# Patient Record
Sex: Female | Born: 1950
Health system: Southern US, Community
[De-identification: ages and names within clinical notes are randomized; demographics above are authoritative.]

## PROBLEM LIST (undated history)

## (undated) DIAGNOSIS — E119 Type 2 diabetes mellitus without complications: Secondary | ICD-10-CM

## (undated) DIAGNOSIS — I10 Essential (primary) hypertension: Secondary | ICD-10-CM

## (undated) HISTORY — DX: Type 2 diabetes mellitus without complications: E11.9

## (undated) HISTORY — PX: BUNIONECTOMY: SHX129

## (undated) HISTORY — DX: Essential (primary) hypertension: I10

## (undated) HISTORY — PX: TONSILLECTOMY: SUR1361

---

## 1999-10-18 ENCOUNTER — Other Ambulatory Visit: Admission: RE | Admit: 1999-10-18 | Discharge: 1999-10-18 | Payer: Self-pay | Admitting: Obstetrics and Gynecology

## 2000-07-23 ENCOUNTER — Encounter: Payer: Self-pay | Admitting: Family Medicine

## 2000-07-23 ENCOUNTER — Encounter: Admission: RE | Admit: 2000-07-23 | Discharge: 2000-07-23 | Payer: Self-pay | Admitting: Family Medicine

## 2000-08-09 ENCOUNTER — Other Ambulatory Visit: Admission: RE | Admit: 2000-08-09 | Discharge: 2000-08-09 | Payer: Self-pay | Admitting: Family Medicine

## 2001-04-26 ENCOUNTER — Other Ambulatory Visit: Admission: RE | Admit: 2001-04-26 | Discharge: 2001-04-26 | Payer: Self-pay | Admitting: Obstetrics and Gynecology

## 2003-01-01 ENCOUNTER — Other Ambulatory Visit: Admission: RE | Admit: 2003-01-01 | Discharge: 2003-01-01 | Payer: Self-pay | Admitting: Obstetrics and Gynecology

## 2004-09-29 ENCOUNTER — Encounter: Admission: RE | Admit: 2004-09-29 | Discharge: 2004-09-29 | Payer: Self-pay | Admitting: Family Medicine

## 2004-12-07 ENCOUNTER — Encounter: Admission: RE | Admit: 2004-12-07 | Discharge: 2004-12-07 | Payer: Self-pay | Admitting: Internal Medicine

## 2013-05-05 ENCOUNTER — Other Ambulatory Visit: Payer: Self-pay | Admitting: Nurse Practitioner

## 2013-05-05 DIAGNOSIS — Z87898 Personal history of other specified conditions: Secondary | ICD-10-CM

## 2013-07-23 ENCOUNTER — Ambulatory Visit (INDEPENDENT_AMBULATORY_CARE_PROVIDER_SITE_OTHER): Payer: BC Managed Care – PPO | Admitting: Nurse Practitioner

## 2013-07-23 ENCOUNTER — Encounter (INDEPENDENT_AMBULATORY_CARE_PROVIDER_SITE_OTHER): Payer: Self-pay

## 2013-07-23 ENCOUNTER — Encounter: Payer: Self-pay | Admitting: Nurse Practitioner

## 2013-07-23 VITALS — BP 152/93 | HR 61 | Temp 97.6°F | Ht 64.0 in | Wt 184.0 lb

## 2013-07-23 DIAGNOSIS — Z01419 Encounter for gynecological examination (general) (routine) without abnormal findings: Secondary | ICD-10-CM

## 2013-07-23 DIAGNOSIS — Z0000 Encounter for general adult medical examination without abnormal findings: Secondary | ICD-10-CM

## 2013-07-23 DIAGNOSIS — Z9189 Other specified personal risk factors, not elsewhere classified: Secondary | ICD-10-CM

## 2013-07-23 LAB — POCT CBC
Granulocyte percent: 70.6 %G (ref 37–80)
HCT, POC: 43.9 % (ref 37.7–47.9)
Hemoglobin: 14 g/dL (ref 12.2–16.2)
MCHC: 31.8 g/dL (ref 31.8–35.4)
MPV: 8.1 fL (ref 0–99.8)
POC Granulocyte: 8 — AB (ref 2–6.9)
POC LYMPH PERCENT: 26.7 %L (ref 10–50)
Platelet Count, POC: 260 10*3/uL (ref 142–424)
RBC: 5.5 M/uL — AB (ref 4.04–5.48)

## 2013-07-23 LAB — POCT URINALYSIS DIPSTICK
Bilirubin, UA: NEGATIVE
Glucose, UA: NEGATIVE
Ketones, UA: NEGATIVE
Nitrite, UA: NEGATIVE
Protein, UA: NEGATIVE
Spec Grav, UA: 1.015
Urobilinogen, UA: NEGATIVE
pH, UA: 5

## 2013-07-23 NOTE — Progress Notes (Signed)
  Subjective:    Patient ID: Kara Cortez, female    DOB: Dec 18, 1950, 62 y.o.   MRN: [id number]  HPI  Patient in today for CPE and PAP- Doing well- no current medical problems- no meds Petinet has no complaints today.     Review of Systems  Constitutional: Negative.   HENT: Negative.   Eyes: Negative.   Respiratory: Negative.   Cardiovascular: Negative.   Gastrointestinal: Negative.   Endocrine: Negative.   Genitourinary: Negative.   Musculoskeletal: Negative.   Hematological: Negative.   Psychiatric/Behavioral: Negative.        Objective:   Physical Exam  Constitutional: She is oriented to person, place, and time. She appears well-developed and well-nourished.  HENT:  Head: Normocephalic.  Right Ear: Hearing, tympanic membrane, external ear and ear canal normal.  Left Ear: Hearing, tympanic membrane, external ear and ear canal normal.  Nose: Nose normal.  Mouth/Throat: Uvula is midline and oropharynx is clear and moist.  Eyes: Conjunctivae and EOM are normal. Pupils are equal, round, and reactive to light.  Neck: Normal range of motion and full passive range of motion without pain. Neck supple. No JVD present. Carotid bruit is not present. No mass and no thyromegaly present.  Cardiovascular: Normal rate, normal heart sounds and intact distal pulses.   No murmur heard. Pulmonary/Chest: Effort normal and breath sounds normal.  Abdominal: Soft. Bowel sounds are normal. She exhibits no mass. There is no tenderness.  Genitourinary: Vagina normal and uterus normal. No breast swelling, tenderness, discharge or bleeding.  bimanual exam-No adnexal masses or tenderness. Cervix parous and pink- no discharge   Musculoskeletal: Normal range of motion.  Lymphadenopathy:    She has no cervical adenopathy.  Neurological: She is alert and oriented to person, place, and time.  Skin: Skin is warm and dry.  Psychiatric: She has a normal mood and affect. Her behavior is normal.  Judgment and thought content normal.    BP 152/93  Pulse 61  Temp(Src) 97.6 F (36.4 C) (Oral)  Ht 5\' 4"  (1.626 m)  Wt 184 lb (83.462 kg)  BMI 31.57 kg/m2       Assessment & Plan:   1. Annual physical exam   2. GYN exam for high-risk Medicare patient    Orders Placed This Encounter  Procedures  . CMP14+EGFR  . NMR, lipoprofile  . Thyroid Panel With TSH  . POCT urinalysis dipstick  . POCT CBC   Diet and exercise encouraged Labs pending Follow up in 1 year and prn Keep diary of blood pressure if stays above 140 systolic will need meds Mary-Margaret Daphine Deutscher, FNP

## 2013-07-23 NOTE — Patient Instructions (Signed)

## 2013-07-25 LAB — CMP14+EGFR
ALT: 14 IU/L (ref 0–32)
AST: 16 IU/L (ref 0–40)
Albumin/Globulin Ratio: 1.8 (ref 1.1–2.5)
Albumin: 4.3 g/dL (ref 3.6–4.8)
Alkaline Phosphatase: 103 IU/L (ref 39–117)
BUN/Creatinine Ratio: 17 (ref 11–26)
BUN: 13 mg/dL (ref 8–27)
CO2: 24 mmol/L (ref 18–29)
Calcium: 9 mg/dL (ref 8.6–10.2)
Chloride: 100 mmol/L (ref 97–108)
Creatinine, Ser: 0.77 mg/dL (ref 0.57–1.00)
GFR calc Af Amer: 96 mL/min/{1.73_m2} (ref >59)
GFR calc non Af Amer: 84 mL/min/{1.73_m2} (ref >59)
Globulin, Total: 2.4 g/dL (ref 1.5–4.5)
Glucose: 91 mg/dL (ref 65–99)
Potassium: 4 mmol/L (ref 3.5–5.2)
Sodium: 141 mmol/L (ref 134–144)
Total Bilirubin: 0.3 mg/dL (ref 0.0–1.2)
Total Protein: 6.7 g/dL (ref 6.0–8.5)

## 2013-07-25 LAB — NMR, LIPOPROFILE
Cholesterol: 190 mg/dL (ref <200)
HDL Cholesterol by NMR: 66 mg/dL (ref >=40)
HDL Particle Number: 34.8 umol/L (ref >=30.5)
LDL Particle Number: 858 nmol/L (ref <1000)
LDL Size: 21.1 nm (ref >20.5)
LDLC SERPL CALC-MCNC: 95 mg/dL (ref <100)
LP-IR Score: 37 (ref <=45)
Small LDL Particle Number: 278 nmol/L (ref <=527)
Triglycerides by NMR: 145 mg/dL (ref <150)

## 2013-07-25 LAB — THYROID PANEL WITH TSH
Free Thyroxine Index: 1.9 (ref 1.2–4.9)
T3 Uptake Ratio: 31 % (ref 24–39)
TSH: 1.87 u[IU]/mL (ref 0.450–4.500)

## 2013-07-28 LAB — PAP IG W/ RFLX HPV ASCU

## 2013-08-04 ENCOUNTER — Encounter: Payer: Self-pay | Admitting: *Deleted

## 2014-08-21 ENCOUNTER — Ambulatory Visit (INDEPENDENT_AMBULATORY_CARE_PROVIDER_SITE_OTHER): Payer: BC Managed Care – PPO | Admitting: Nurse Practitioner

## 2014-08-21 ENCOUNTER — Encounter: Payer: Self-pay | Admitting: Nurse Practitioner

## 2014-08-21 VITALS — BP 168/95 | HR 63 | Temp 97.4°F | Ht 64.0 in | Wt 178.0 lb

## 2014-08-21 DIAGNOSIS — I10 Essential (primary) hypertension: Secondary | ICD-10-CM

## 2014-08-21 DIAGNOSIS — Z0000 Encounter for general adult medical examination without abnormal findings: Secondary | ICD-10-CM

## 2014-08-21 MED ORDER — LISINOPRIL 20 MG PO TABS: 20.0000 mg | ORAL_TABLET | Freq: Every day | ORAL | Status: DC

## 2014-08-21 NOTE — Patient Instructions (Signed)

## 2014-08-21 NOTE — Progress Notes (Signed)
   Subjective:    Patient ID: Kara Cortez, female    DOB: May 12, 1951, 63 y.o.   MRN: [id number]  HPI Patient in today for annual physical- she has no medical problems and is on no meds.    Review of Systems  Constitutional: Negative.   HENT: Negative.   Respiratory: Negative.   Cardiovascular: Negative.   Gastrointestinal: Negative.   Genitourinary: Negative.   Neurological: Negative.   Psychiatric/Behavioral: Negative.   All other systems reviewed and are negative.      Objective:   Physical Exam  Constitutional: She is oriented to person, place, and time. She appears well-developed and well-nourished.  HENT:  Nose: Nose normal.  Mouth/Throat: Oropharynx is clear and moist.  Eyes: EOM are normal.  Neck: Trachea normal, normal range of motion and full passive range of motion without pain. Neck supple. No JVD present. Carotid bruit is not present. No thyromegaly present.  Cardiovascular: Normal rate, regular rhythm, normal heart sounds and intact distal pulses.  Exam reveals no gallop and no friction rub.   No murmur heard. Pulmonary/Chest: Effort normal and breath sounds normal.  Abdominal: Soft. Bowel sounds are normal. She exhibits no distension and no mass. There is no tenderness.  Musculoskeletal: Normal range of motion.  Lymphadenopathy:    She has no cervical adenopathy.  Neurological: She is alert and oriented to person, place, and time. She has normal reflexes.  Skin: Skin is warm and dry.  Psychiatric: She has a normal mood and affect. Her behavior is normal. Judgment and thought content normal.   BP 168/95 mmHg  Pulse 63  Temp(Src) 97.4 F (36.3 C) (Oral)  Ht _0  (1.626 m)  Wt 178 lb (80.74 kg)  BMI 30.54 kg/m2        Assessment & Plan:  1. Annual physical exam Labs pending - POCT CBC - CMP14+EGFR - NMR, lipoprofile - Thyroid Panel With TSH - Vit D  25 hydroxy (rtn osteoporosis monitoring) - DG Chest 2 View; Future - EKG 12-Lead  2.  Essential hypertension Low sat diet - lisinopril (PRINIVIL,ZESTRIL) 20 MG tablet; Take 1 tablet (20 mg total) by mouth daily.  Dispense: 30 tablet; Refill: 5  Health  Maintenance reviewed Labs pending Diet and exercise encouraged RTO prn  Mary-Margaret Hassell Done, FNP

## 2014-08-22 LAB — CMP14+EGFR
A/G RATIO: 1.9 (ref 1.1–2.5)
ALT: 15 IU/L (ref 0–32)
AST: 13 IU/L (ref 0–40)
Albumin: 4.3 g/dL (ref 3.6–4.8)
Alkaline Phosphatase: 96 IU/L (ref 39–117)
BUN / CREAT RATIO: 16 (ref 11–26)
BUN: 12 mg/dL (ref 8–27)
CO2: 25 mmol/L (ref 18–29)
Calcium: 9.2 mg/dL (ref 8.7–10.3)
Chloride: 98 mmol/L (ref 97–108)
Creatinine, Ser: 0.77 mg/dL (ref 0.57–1.00)
GFR calc non Af Amer: 83 mL/min/{1.73_m2} (ref >59)
GFR, EST AFRICAN AMERICAN: 96 mL/min/{1.73_m2} (ref >59)
GLUCOSE: 106 mg/dL — AB (ref 65–99)
Globulin, Total: 2.3 g/dL (ref 1.5–4.5)
POTASSIUM: 3.8 mmol/L (ref 3.5–5.2)
Sodium: 139 mmol/L (ref 134–144)
TOTAL PROTEIN: 6.6 g/dL (ref 6.0–8.5)
Total Bilirubin: 0.4 mg/dL (ref 0.0–1.2)

## 2014-08-22 LAB — CBC WITH DIFFERENTIAL
BASOS: 1 %
Basophils Absolute: 0.1 10*3/uL (ref 0.0–0.2)
Eos: 3 %
Eosinophils Absolute: 0.3 10*3/uL (ref 0.0–0.4)
HEMATOCRIT: 41.3 % (ref 34.0–46.6)
Hemoglobin: 13.6 g/dL (ref 11.1–15.9)
IMMATURE GRANS (ABS): 0 10*3/uL (ref 0.0–0.1)
Immature Granulocytes: 0 %
LYMPHS: 30 %
Lymphocytes Absolute: 3.3 10*3/uL — ABNORMAL HIGH (ref 0.7–3.1)
MCH: 26.8 pg (ref 26.6–33.0)
MCHC: 32.9 g/dL (ref 31.5–35.7)
MCV: 81 fL (ref 79–97)
MONOCYTES: 5 %
Monocytes Absolute: 0.5 10*3/uL (ref 0.1–0.9)
NEUTROS ABS: 6.9 10*3/uL (ref 1.4–7.0)
NEUTROS PCT: 61 %
Platelets: 264 10*3/uL (ref 150–379)
RBC: 5.08 x10E6/uL (ref 3.77–5.28)
RDW: 15.1 % (ref 12.3–15.4)
WBC: 11.2 10*3/uL — AB (ref 3.4–10.8)

## 2014-08-22 LAB — THYROID PANEL WITH TSH
Free Thyroxine Index: 2.1 (ref 1.2–4.9)
T3 Uptake Ratio: 29 % (ref 24–39)
T4, Total: 7.3 ug/dL (ref 4.5–12.0)
TSH: 1.8 u[IU]/mL (ref 0.450–4.500)

## 2014-08-22 LAB — NMR, LIPOPROFILE
CHOLESTEROL: 195 mg/dL (ref 100–199)
HDL Cholesterol by NMR: 66 mg/dL (ref >39)
HDL Particle Number: 34.6 umol/L (ref >=30.5)
LDL Particle Number: 1024 nmol/L — ABNORMAL HIGH (ref <1000)
LDL SIZE: 20.9 nm (ref >20.5)
LDL-C: 97 mg/dL (ref 0–99)
LP-IR Score: 51 — ABNORMAL HIGH (ref <=45)
Small LDL Particle Number: 355 nmol/L (ref <=527)
TRIGLYCERIDES BY NMR: 162 mg/dL — AB (ref 0–149)

## 2014-08-22 LAB — VITAMIN D 25 HYDROXY (VIT D DEFICIENCY, FRACTURES): Vit D, 25-Hydroxy: 19.2 ng/mL — ABNORMAL LOW (ref 30.0–100.0)

## 2014-08-24 ENCOUNTER — Telehealth: Payer: Self-pay | Admitting: Family Medicine

## 2014-08-24 NOTE — Telephone Encounter (Signed)
-----   Message from [facility], [place] sent at 08/24/2014  1:09 PM EST ----- Cbc normal- WBC slightly elevated Kidney and liver function stable choleswterol looks great Thyroid normal Continue current meds- low fat diet and exercise and recheck in 3 months

## 2014-08-25 ENCOUNTER — Telehealth: Payer: Self-pay

## 2014-08-25 NOTE — Telephone Encounter (Signed)
-----   Message from [facility], [place] sent at 08/24/2014  1:09 PM EST ----- Cbc normal- WBC slightly elevated Kidney and liver function stable choleswterol looks great Thyroid normal Continue current meds- low fat diet and exercise and recheck in 3 months

## 2014-08-25 NOTE — Telephone Encounter (Signed)
Pt aware of lab results 

## 2014-08-25 NOTE — Telephone Encounter (Signed)
Multiple messages left; letter sent to patient with results

## 2014-11-30 ENCOUNTER — Encounter: Payer: Self-pay | Admitting: Nurse Practitioner

## 2014-11-30 ENCOUNTER — Ambulatory Visit (INDEPENDENT_AMBULATORY_CARE_PROVIDER_SITE_OTHER): Payer: BLUE CROSS/BLUE SHIELD | Admitting: Nurse Practitioner

## 2014-11-30 ENCOUNTER — Ambulatory Visit (INDEPENDENT_AMBULATORY_CARE_PROVIDER_SITE_OTHER): Payer: BLUE CROSS/BLUE SHIELD

## 2014-11-30 VITALS — BP 128/87 | HR 75 | Temp 97.4°F | Ht 64.0 in | Wt 178.0 lb

## 2014-11-30 DIAGNOSIS — I10 Essential (primary) hypertension: Secondary | ICD-10-CM | POA: Diagnosis not present

## 2014-11-30 DIAGNOSIS — R739 Hyperglycemia, unspecified: Secondary | ICD-10-CM | POA: Diagnosis not present

## 2014-11-30 MED ORDER — LISINOPRIL 20 MG PO TABS: 20.0000 mg | ORAL_TABLET | Freq: Every day | ORAL | Status: DC

## 2014-11-30 NOTE — Progress Notes (Signed)
   Subjective:    Patient ID: Kara Cortez, female    DOB: [birth year], 64 y.o.   MRN: [id number]  Patient is here for follow up of newly diagnosed hypertension within the last year. Patient reports she remembers to take her medication most days, and her reported spot check blood pressures are in the 130/80 range.   Hypertension This is a new problem. The current episode started more than 1 month ago. The problem has been gradually improving since onset. The problem is controlled. Pertinent negatives include no blurred vision, chest pain, orthopnea, palpitations, peripheral edema or shortness of breath. Risk factors for coronary artery disease include sedentary lifestyle, stress, family history and post-menopausal state. Past treatments include ACE inhibitors. There are no compliance problems.         Review of Systems  Constitutional: Negative.  Negative for unexpected weight change.  HENT: Negative.   Eyes: Negative.  Negative for blurred vision.  Respiratory: Negative.  Negative for chest tightness and shortness of breath.   Cardiovascular: Negative.  Negative for chest pain, palpitations and orthopnea.  All other systems reviewed and are negative.      Objective:   Physical Exam  Constitutional: She is oriented to person, place, and time. She appears well-developed and well-nourished.  HENT:  Head: Normocephalic.  Nose: Nose normal.  Mouth/Throat: Oropharynx is clear and moist.  Eyes: Conjunctivae and EOM are normal. Pupils are equal, round, and reactive to light.  Neck: Trachea normal, normal range of motion and full passive range of motion without pain. Neck supple. No JVD present. Carotid bruit is not present. No thyromegaly present.  Cardiovascular: Normal rate, regular rhythm, normal heart sounds and intact distal pulses.  Exam reveals no gallop and no friction rub.   No murmur heard. Pulmonary/Chest: Effort normal and breath sounds normal.  Abdominal: Soft. Bowel  sounds are normal. She exhibits no distension and no mass. There is no tenderness.  Musculoskeletal: Normal range of motion.  Lymphadenopathy:    She has no cervical adenopathy.  Neurological: She is alert and oriented to person, place, and time. She has normal reflexes.  Skin: Skin is warm and dry.  Psychiatric: She has a normal mood and affect. Her behavior is normal. Judgment and thought content normal.  Vitals reviewed.   BP 128/87 mmHg  Pulse 75  Temp(Src) 97.4 F (36.3 C) (Oral)  Ht _0  (1.626 m)  Wt 178 lb (80.74 kg)  BMI 30.54 kg/m2  Chest xray- normal no acute or chronic findings-Preliminary reading by Ronnald Collum, FNP  [facility]  EKG- Kerry Hough, FNP      Assessment & Plan:  1. Essential hypertension Do not add salt to diet - lisinopril (PRINIVIL,ZESTRIL) 20 MG tablet; Take 1 tablet (20 mg total) by mouth daily.  Dispense: 30 tablet; Refill: 5 - EKG 12-Lead - DG Chest 2 View; Future - CMP14+EGFR - NMR, lipoprofile - Vit D  25 hydroxy (rtn osteoporosis monitoring)    Labs pending Health maintenance reviewed Diet and exercise encouraged Continue all meds Follow up  In 6 months   [place], FNP

## 2014-11-30 NOTE — Patient Instructions (Signed)
Cardiac Diet This diet can help prevent heart disease and stroke. Many factors influence your heart health, including eating and exercise habits. Coronary risk rises a lot with abnormal blood fat (lipid) levels. Cardiac meal planning includes limiting unhealthy fats, increasing healthy fats, and making other small dietary changes. General guidelines are as follows:  Adjust calorie intake to reach and maintain desirable body weight.  Limit total fat intake to less than 30% of total calories. Saturated fat should be less than 7% of calories.  Saturated fats are found in animal products and in some vegetable products. Saturated vegetable fats are found in coconut oil, cocoa butter, palm oil, and palm kernel oil. Read labels carefully to avoid these products as much as possible. Use butter in moderation. Choose tub margarines and oils that have 2 grams of fat or less. Good cooking oils are canola and olive oils.  Practice low-fat cooking techniques. Do not fry food. Instead, broil, bake, boil, steam, grill, roast on a rack, stir-fry, or microwave it. Other fat reducing suggestions include:  Remove the skin from poultry.  Remove all visible fat from meats.  Skim the fat off stews, soups, and gravies before serving them.  Steam vegetables in water or broth instead of sauting them in fat.  Avoid foods with trans fat (or hydrogenated oils), such as commercially fried foods and commercially baked goods. Commercial shortening and deep-frying fats will contain trans fat.  Increase intake of fruits, vegetables, whole grains, and legumes to replace foods high in fat.  Increase consumption of nuts, legumes, and seeds to at least 4 servings weekly. One serving of a legume equals  cup, and 1 serving of nuts or seeds equals  cup.  Choose whole grains more often. Have 3 servings per day (a serving is 1 ounce [oz]).  Eat 4 to 5 servings of vegetables per day. A serving of vegetables is 1 cup of raw leafy  vegetables;  cup of raw or cooked cut-up vegetables;  cup of vegetable juice.  Eat 4 to 5 servings of fruit per day. A serving of fruit is 1 medium whole fruit;  cup of dried fruit;  cup of fresh, frozen, or canned fruit;  cup of 100% fruit juice.  Increase your intake of dietary fiber to 20 to 30 grams per day. Insoluble fiber may help lower your risk of heart disease and may help curb your appetite.  Soluble fiber binds cholesterol to be removed from the blood. Foods high in soluble fiber are dried beans, citrus fruits, oats, apples, bananas, broccoli, Brussels sprouts, and eggplant.  Try to include foods fortified with plant sterols or stanols, such as yogurt, breads, juices, or margarines. Choose several fortified foods to achieve a daily intake of 2 to 3 grams of plant sterols or stanols.  Foods with omega-3 fats can help reduce your risk of heart disease. Aim to have a 3.5 oz portion of fatty fish twice per week, such as salmon, mackerel, albacore tuna, sardines, lake trout, or herring. If you wish to take a fish oil supplement, choose one that contains 1 gram of both DHA and EPA.  Limit processed meats to 2 servings (3 oz portion) weekly.  Limit the sodium in your diet to 1500 milligrams (mg) per day. If you have high blood pressure, talk to a registered dietitian about a DASH (Dietary Approaches to Stop Hypertension) eating plan.  Limit sweets and beverages with added sugar, such as soda, to no more than 5 servings per week. One   serving is:   1 tablespoon sugar.  1 tablespoon jelly or jam.   cup sorbet.  1 cup lemonade.   cup regular soda. CHOOSING FOODS Starches  Allowed: Breads: All kinds (wheat, rye, raisin, white, oatmeal, Italian, French, and English muffin bread). Low-fat rolls: English muffins, frankfurter and hamburger buns, bagels, pita bread, tortillas (not fried). Pancakes, waffles, biscuits, and muffins made with recommended oil.  Avoid: Products made with  saturated or trans fats, oils, or whole milk products. Butter rolls, cheese breads, croissants. Commercial doughnuts, muffins, sweet rolls, biscuits, waffles, pancakes, store-bought mixes. Crackers  Allowed: Low-fat crackers and snacks: Animal, graham, rye, saltine (with recommended oil, no lard), oyster, and matzo crackers. Bread sticks, melba toast, rusks, flatbread, pretzels, and light popcorn.  Avoid: High-fat crackers: cheese crackers, butter crackers, and those made with coconut, palm oil, or trans fat (hydrogenated oils). Buttered popcorn. Cereals  Allowed: Hot or cold whole-grain cereals.  Avoid: Cereals containing coconut, hydrogenated vegetable fat, or animal fat. Potatoes / Pasta / Rice  Allowed: All kinds of potatoes, rice, and pasta (such as macaroni, spaghetti, and noodles).  Avoid: Pasta or rice prepared with cream sauce or high-fat cheese. Chow mein noodles, French fries. Vegetables  Allowed: All vegetables and vegetable juices.  Avoid: Fried vegetables. Vegetables in cream, butter, or high-fat cheese sauces. Limit coconut. Fruit in cream or custard. Protein  Allowed: Limit your intake of meat, seafood, and poultry to no more than 6 oz (cooked weight) per day. All lean, well-trimmed beef, veal, pork, and lamb. All chicken and turkey without skin. All fish and shellfish. Wild game: wild duck, rabbit, pheasant, and venison. Egg whites or low-cholesterol egg substitutes may be used as desired. Meatless dishes: recipes with dried beans, peas, lentils, and tofu (soybean curd). Seeds and nuts: all seeds and most nuts.  Avoid: Prime grade and other heavily marbled and fatty meats, such as short ribs, spare ribs, rib eye roast or steak, frankfurters, sausage, bacon, and high-fat luncheon meats, mutton. Caviar. Commercially fried fish. Domestic duck, goose, venison sausage. Organ meats: liver, gizzard, heart, chitterlings, brains, kidney, sweetbreads. Dairy  Allowed: Low-fat  cheeses: nonfat or low-fat cottage cheese (1% or 2% fat), cheeses made with part skim milk, such as mozzarella, farmers, string, or ricotta. (Cheeses should be labeled no more than 2 to 6 grams fat per oz.). Skim (or 1%) milk: liquid, powdered, or evaporated. Buttermilk made with low-fat milk. Drinks made with skim or low-fat milk or cocoa. Chocolate milk or cocoa made with skim or low-fat (1%) milk. Nonfat or low-fat yogurt.  Avoid: Whole milk cheeses, including colby, cheddar, muenster, Monterey Jack, Havarti, Brie, Camembert, American, Swiss, and blue. Creamed cottage cheese, cream cheese. Whole milk and whole milk products, including buttermilk or yogurt made from whole milk, drinks made from whole milk. Condensed milk, evaporated whole milk, and 2% milk. Soups and Combination Foods  Allowed: Low-fat low-sodium soups: broth, dehydrated soups, homemade broth, soups with the fat removed, homemade cream soups made with skim or low-fat milk. Low-fat spaghetti, lasagna, chili, and Spanish rice if low-fat ingredients and low-fat cooking techniques are used.  Avoid: Cream soups made with whole milk, cream, or high-fat cheese. All other soups. Desserts and Sweets  Allowed: Sherbet, fruit ices, gelatins, meringues, and angel food cake. Homemade desserts with recommended fats, oils, and milk products. Jam, jelly, honey, marmalade, sugars, and syrups. Pure sugar candy, such as gum drops, hard candy, jelly beans, marshmallows, mints, and small amounts of dark chocolate.  Avoid: Commercially prepared   cakes, pies, cookies, frosting, pudding, or mixes for these products. Desserts containing whole milk products, chocolate, coconut, lard, palm oil, or palm kernel oil. Ice cream or ice cream drinks. Candy that contains chocolate, coconut, butter, hydrogenated fat, or unknown ingredients. Buttered syrups. Fats and Oils  Allowed: Vegetable oils: safflower, sunflower, corn, soybean, cottonseed, sesame, canola, olive,  or peanut. Non-hydrogenated margarines. Salad dressing or mayonnaise: homemade or commercial, made with a recommended oil. Low or nonfat salad dressing or mayonnaise.  Limit added fats and oils to 6 to 8 tsp per day (includes fats used in cooking, baking, salads, and spreads on bread). Remember to count the "hidden fats" in foods.  Avoid: Solid fats and shortenings: butter, lard, salt pork, bacon drippings. Gravy containing meat fat, shortening, or suet. Cocoa butter, coconut. Coconut oil, palm oil, palm kernel oil, or hydrogenated oils: these ingredients are often used in bakery products, nondairy creamers, whipped toppings, candy, and commercially fried foods. Read labels carefully. Salad dressings made of unknown oils, sour cream, or cheese, such as blue cheese and Roquefort. Cream, all kinds: half-and-half, light, heavy, or whipping. Sour cream or cream cheese (even if "light" or low-fat). Nondairy cream substitutes: coffee creamers and sour cream substitutes made with palm, palm kernel, hydrogenated oils, or coconut oil. Beverages  Allowed: Coffee (regular or decaffeinated), tea. Diet carbonated beverages, mineral water. Alcohol: Check with your caregiver. Moderation is recommended.  Avoid: Whole milk, regular sodas, and juice drinks with added sugar. Condiments  Allowed: All seasonings and condiments. Cocoa powder. "Cream" sauces made with recommended ingredients.  Avoid: Carob powder made with hydrogenated fats. SAMPLE MENU Breakfast   cup orange juice   cup oatmeal  1 slice toast  1 tsp margarine  1 cup skim milk Lunch  Turkey sandwich with 2 oz turkey, 2 slices bread  Lettuce and tomato slices  Fresh fruit  Carrot sticks  Coffee or tea Snack  Fresh fruit or low-fat crackers Dinner  3 oz lean ground beef  1 baked potato  1 tsp margarine   cup asparagus  Lettuce salad  1 tbs non-creamy dressing   cup peach slices  1 cup skim milk Document Released:  06/27/2008 Document Revised: 03/19/2012 Document Reviewed: 11/18/2013 ExitCare Patient Information 2015 [employer]. This information is not intended to replace advice given to you by your health care provider. Make sure you discuss any questions you have with your health care provider.  

## 2014-12-01 ENCOUNTER — Encounter: Payer: Self-pay | Admitting: Nurse Practitioner

## 2014-12-01 ENCOUNTER — Other Ambulatory Visit: Payer: Self-pay | Admitting: Nurse Practitioner

## 2014-12-01 DIAGNOSIS — E119 Type 2 diabetes mellitus without complications: Secondary | ICD-10-CM | POA: Insufficient documentation

## 2014-12-01 LAB — NMR, LIPOPROFILE
CHOLESTEROL: 207 mg/dL — AB (ref 100–199)
HDL CHOLESTEROL BY NMR: 69 mg/dL (ref >39)
HDL PARTICLE NUMBER: 36.7 umol/L (ref >=30.5)
LDL PARTICLE NUMBER: 1129 nmol/L — AB (ref <1000)
LDL Size: 21.2 nm (ref >20.5)
LDL-C: 115 mg/dL — ABNORMAL HIGH (ref 0–99)
LP-IR Score: 42 (ref <=45)
Small LDL Particle Number: 375 nmol/L (ref <=527)
TRIGLYCERIDES BY NMR: 116 mg/dL (ref 0–149)

## 2014-12-01 LAB — CMP14+EGFR
ALK PHOS: 97 IU/L (ref 39–117)
ALT: 11 IU/L (ref 0–32)
AST: 12 IU/L (ref 0–40)
Albumin/Globulin Ratio: 1.6 (ref 1.1–2.5)
Albumin: 4.1 g/dL (ref 3.6–4.8)
BILIRUBIN TOTAL: 0.3 mg/dL (ref 0.0–1.2)
BUN / CREAT RATIO: 17 (ref 11–26)
BUN: 13 mg/dL (ref 8–27)
CO2: 24 mmol/L (ref 18–29)
Calcium: 9.3 mg/dL (ref 8.7–10.3)
Chloride: 100 mmol/L (ref 97–108)
Creatinine, Ser: 0.75 mg/dL (ref 0.57–1.00)
GFR calc Af Amer: 99 mL/min/{1.73_m2} (ref >59)
GFR calc non Af Amer: 86 mL/min/{1.73_m2} (ref >59)
GLUCOSE: 147 mg/dL — AB (ref 65–99)
Globulin, Total: 2.5 g/dL (ref 1.5–4.5)
POTASSIUM: 4.4 mmol/L (ref 3.5–5.2)
Sodium: 139 mmol/L (ref 134–144)
TOTAL PROTEIN: 6.6 g/dL (ref 6.0–8.5)

## 2014-12-01 LAB — VITAMIN D 25 HYDROXY (VIT D DEFICIENCY, FRACTURES): Vit D, 25-Hydroxy: 21.4 ng/mL — ABNORMAL LOW (ref 30.0–100.0)

## 2014-12-01 LAB — POCT GLYCOSYLATED HEMOGLOBIN (HGB A1C): HEMOGLOBIN A1C: 7.9

## 2014-12-01 MED ORDER — METFORMIN HCL 500 MG PO TABS
500.0000 mg | ORAL_TABLET | Freq: Two times a day (BID) | ORAL | Status: DC
Start: 2014-12-01 — End: 2015-06-17

## 2014-12-01 NOTE — Addendum Note (Signed)
Addended by: Earlene Plater on: 12/01/2014 03:31 PM   Modules accepted: Orders

## 2014-12-02 ENCOUNTER — Telehealth: Payer: Self-pay | Admitting: *Deleted

## 2014-12-02 NOTE — Telephone Encounter (Signed)
-----   Message from Chevis Pretty, [place] sent at 12/01/2014  4:56 PM EST ----- hgba1c elevated- new diabetic rx for metformin 500mg  1 po bid- rx sent to pharamacy NEEDS APPOINTMENT with clinical pharmacist - NEW DIABETIC

## 2014-12-02 NOTE — Telephone Encounter (Signed)
Pt notified of results and RX Verbalizes understanding Will call back to schedule appt

## 2015-06-04 ENCOUNTER — Ambulatory Visit: Payer: BLUE CROSS/BLUE SHIELD | Admitting: Nurse Practitioner

## 2015-06-17 ENCOUNTER — Encounter: Payer: Self-pay | Admitting: Nurse Practitioner

## 2015-06-17 ENCOUNTER — Ambulatory Visit (INDEPENDENT_AMBULATORY_CARE_PROVIDER_SITE_OTHER): Payer: BLUE CROSS/BLUE SHIELD | Admitting: Nurse Practitioner

## 2015-06-17 VITALS — BP 136/84 | HR 74 | Temp 97.2°F | Ht 64.0 in | Wt 178.0 lb

## 2015-06-17 DIAGNOSIS — Z6830 Body mass index (BMI) 30.0-30.9, adult: Secondary | ICD-10-CM | POA: Diagnosis not present

## 2015-06-17 DIAGNOSIS — E119 Type 2 diabetes mellitus without complications: Secondary | ICD-10-CM | POA: Diagnosis not present

## 2015-06-17 DIAGNOSIS — I10 Essential (primary) hypertension: Secondary | ICD-10-CM | POA: Diagnosis not present

## 2015-06-17 DIAGNOSIS — Z6829 Body mass index (BMI) 29.0-29.9, adult: Secondary | ICD-10-CM | POA: Insufficient documentation

## 2015-06-17 LAB — POCT GLYCOSYLATED HEMOGLOBIN (HGB A1C): HEMOGLOBIN A1C: 7.7

## 2015-06-17 LAB — POCT UA - MICROALBUMIN: MICROALBUMIN (UR) POC: NEGATIVE mg/L

## 2015-06-17 MED ORDER — LISINOPRIL 20 MG PO TABS: 20.0000 mg | ORAL_TABLET | Freq: Every day | ORAL | Status: DC

## 2015-06-17 MED ORDER — METFORMIN HCL 500 MG PO TABS: 500.0000 mg | ORAL_TABLET | Freq: Two times a day (BID) | ORAL | Status: DC

## 2015-06-17 NOTE — Patient Instructions (Signed)
Bone Health Our bones do many things. They provide structure, protect organs, anchor muscles, and store calcium. Adequate calcium in your diet and weight-bearing physical activity help build strong bones, improve bone amounts, and may reduce the risk of weakening of bones (osteoporosis) later in life. PEAK BONE MASS By age 63, the average woman has acquired most of her skeletal bone mass. A large decline occurs in older adults which increases the risk of osteoporosis. In women this occurs around the time of menopause. It is important for young girls to reach their peak bone mass in order to maintain bone health throughout life. A person with high bone mass as a young adult will be more likely to have a higher bone mass later in life. Not enough calcium consumption and physical activity early on could result in a failure to achieve optimum bone mass in adulthood. OSTEOPOROSIS Osteoporosis is a disease of the bones. It is defined as low bone mass with deterioration of bone structure. Osteoporosis leads to an increase risk of fractures with falls. These fractures commonly happen in the wrist, hip, and spine. While men and women of all ages and background can develop osteoporosis, some of the risk factors for osteoporosis are:  Female.  White.  Postmenopausal.  Older adults.  Small in body size.  Eating a diet low in calcium.  Physically inactive.  Smoking.  Use of some medications.  Family history. CALCIUM Calcium is a mineral needed by the body for healthy bones, teeth, and proper function of the heart, muscles, and nerves. The body cannot produce calcium so it must be absorbed through food. Good sources of calcium include:  Dairy products (low fat or nonfat milk, cheese, and yogurt).  Dark green leafy vegetables (bok choy and broccoli).  Calcium fortified foods (orange juice, cereal, bread, soy beverages, and tofu products).  Nuts (almonds). Recommended amounts of calcium vary  for individuals. RECOMMENDED CALCIUM INTAKES Age and Amount in mg per day  Children 1 to 3 years / 700 mg  Children 4 to 8 years / 1,000 mg  Children 9 to 13 years / 1,300 mg  Teens 14 to 18 years / 1,300 mg  Adults 19 to 50 years / 1,000 mg  Adult women 51 to 70 years / 1,200 mg  Adults 71 years and older / 1,200 mg  Pregnant and breastfeeding teens / 1,300 mg  Pregnant and breastfeeding adults / 1,000 mg Vitamin D also plays an important role in healthy bone development. Vitamin D helps in the absorption of calcium. WEIGHT-BEARING PHYSICAL ACTIVITY Regular physical activity has many positive health benefits. Benefits include strong bones. Weight-bearing physical activity early in life is important in reaching peak bone mass. Weight-bearing physical activities cause muscles and bones to work against gravity. Some examples of weight bearing physical activities include:  Walking, jogging, or running.  Field Hockey.  Jumping rope.  Dancing.  Soccer.  Tennis or Racquetball.  Stair climbing.  Basketball.  Hiking.  Weight lifting.  Aerobic fitness classes. Including weight-bearing physical activity into an exercise plan is a great way to keep bones healthy. Adults: Engage in at least 30 minutes of moderate physical activity on most, preferably all, days of the week. Children: Engage in at least 60 minutes of moderate physical activity on most, preferably all, days of the week. FOR MORE INFORMATION [place] Department of Agriculture, Center for Nutrition Policy and Promotion: www.cnpp.usda.gov National Osteoporosis Foundation: www.nof.org Document Released: 12/09/2003 Document Revised: 01/13/2013 Document Reviewed: 03/10/2009 ExitCare Patient Information   2015 [employer]. This information is not intended to replace advice given to you by your health care provider. Make sure you discuss any questions you have with your health care provider.  

## 2015-06-17 NOTE — Progress Notes (Signed)
Subjective:    Patient ID: Kara Cortez, female    DOB: 1951/04/09, 64 y.o.   MRN: [id number]  Patient here today for follow up of chronic medical problems.  Hypertension This is a new problem. The current episode started more than 1 month ago. The problem has been gradually improving since onset. The problem is controlled. Pertinent negatives include no blurred vision, chest pain, headaches, orthopnea, palpitations, peripheral edema or shortness of breath. Risk factors for coronary artery disease include sedentary lifestyle, stress, family history and post-menopausal state. Past treatments include ACE inhibitors. There are no compliance problems.   Diabetes She presents for her follow-up (hgba1c was 8.3% at last visit.) diabetic visit. She has type 2 diabetes mellitus. Her disease course has been stable. Pertinent negatives for hypoglycemia include no dizziness, headaches, nervousness/anxiousness or tremors. Pertinent negatives for diabetes include no blurred vision and no chest pain. There are no hypoglycemic complications. There are no diabetic complications. Risk factors for coronary artery disease include dyslipidemia, hypertension and post-menopausal. Current diabetic treatment includes oral agent (monotherapy) (doesn;t take metformin every day due to diarrhea.). She is compliant with treatment all of the time. Her weight is stable. When asked about meal planning, she reported none. She has not had a previous visit with a dietitian. She rarely participates in exercise. There is no change in her home blood glucose trend. Her breakfast blood glucose is taken between 9-10 am. Her breakfast blood glucose range is generally 140-180 mg/dl. An ACE inhibitor/angiotensin II receptor blocker is being taken. She does not see a podiatrist.Eye exam is not current.        Review of Systems  Constitutional: Negative.  Negative for unexpected weight change.  HENT: Negative.   Eyes: Negative.   Negative for blurred vision.  Respiratory: Negative.  Negative for chest tightness and shortness of breath.   Cardiovascular: Negative.  Negative for chest pain, palpitations and orthopnea.  Genitourinary: Negative.   Neurological: Negative.  Negative for dizziness, tremors and headaches.  Psychiatric/Behavioral: Negative.  The patient is not nervous/anxious.   All other systems reviewed and are negative.      Objective:   Physical Exam  Constitutional: She is oriented to person, place, and time. She appears well-developed and well-nourished.  HENT:  Head: Normocephalic.  Nose: Nose normal.  Mouth/Throat: Oropharynx is clear and moist.  Eyes: Conjunctivae and EOM are normal. Pupils are equal, round, and reactive to light.  Neck: Trachea normal, normal range of motion and full passive range of motion without pain. Neck supple. No JVD present. Carotid bruit is not present. No thyromegaly present.  Cardiovascular: Normal rate, regular rhythm, normal heart sounds and intact distal pulses.  Exam reveals no gallop and no friction rub.   No murmur heard. Pulmonary/Chest: Effort normal and breath sounds normal.  Abdominal: Soft. Bowel sounds are normal. She exhibits no distension and no mass. There is no tenderness.  Musculoskeletal: Normal range of motion.  Lymphadenopathy:    She has no cervical adenopathy.  Neurological: She is alert and oriented to person, place, and time. She has normal reflexes.  Skin: Skin is warm and dry.  Psychiatric: She has a normal mood and affect. Her behavior is normal. Judgment and thought content normal.  Vitals reviewed.   BP 136/84 mmHg  Pulse 74  Temp(Src) 97.2 F (36.2 C) (Oral)  Ht '5\' 4"'  (1.626 m)  Wt 178 lb (80.74 kg)  BMI 30.54 kg/m2    Results for orders placed or performed in visit  on 06/17/15  POCT glycosylated hemoglobin (Hb A1C)  Result Value Ref Range   Hemoglobin A1C 7.7        Assessment & Plan:  1. Essential hypertension Do  not add salt to diet - CMP14+EGFR - Lipid panel - lisinopril (PRINIVIL,ZESTRIL) 20 MG tablet; Take 1 tablet (20 mg total) by mouth daily.  Dispense: 30 tablet; Refill: 5  2. Type 2 diabetes mellitus without complication Low carb diet - POCT glycosylated hemoglobin (Hb A1C) - POCT UA - Microalbumin - metFORMIN (GLUCOPHAGE) 500 MG tablet; Take 1 tablet (500 mg total) by mouth 2 (two) times daily with a meal.  Dispense: 180 tablet; Refill: 1  3. BMI 30.0-30.9,adult Discussed diet and exercise for person with BMI >25 Will recheck weight in 3-6 months     Labs pending Health maintenance reviewed Diet and exercise encouraged Continue all meds Follow up  In 3 month   [place], FNP

## 2015-06-18 LAB — LIPID PANEL
CHOLESTEROL TOTAL: 186 mg/dL (ref 100–199)
Chol/HDL Ratio: 2.8 ratio units (ref 0.0–4.4)
HDL: 67 mg/dL (ref >39)
LDL CALC: 94 mg/dL (ref 0–99)
Triglycerides: 124 mg/dL (ref 0–149)
VLDL Cholesterol Cal: 25 mg/dL (ref 5–40)

## 2015-06-18 LAB — CMP14+EGFR
A/G RATIO: 1.8 (ref 1.1–2.5)
ALT: 14 IU/L (ref 0–32)
AST: 12 IU/L (ref 0–40)
Albumin: 4.2 g/dL (ref 3.6–4.8)
Alkaline Phosphatase: 88 IU/L (ref 39–117)
BILIRUBIN TOTAL: 0.4 mg/dL (ref 0.0–1.2)
BUN/Creatinine Ratio: 14 (ref 11–26)
BUN: 10 mg/dL (ref 8–27)
CO2: 26 mmol/L (ref 18–29)
Calcium: 9.1 mg/dL (ref 8.7–10.3)
Chloride: 99 mmol/L (ref 97–108)
Creatinine, Ser: 0.71 mg/dL (ref 0.57–1.00)
GFR calc Af Amer: 105 mL/min/{1.73_m2} (ref >59)
GFR calc non Af Amer: 91 mL/min/{1.73_m2} (ref >59)
GLOBULIN, TOTAL: 2.4 g/dL (ref 1.5–4.5)
Glucose: 92 mg/dL (ref 65–99)
POTASSIUM: 3.7 mmol/L (ref 3.5–5.2)
Sodium: 141 mmol/L (ref 134–144)
TOTAL PROTEIN: 6.6 g/dL (ref 6.0–8.5)

## 2015-07-21 ENCOUNTER — Encounter: Payer: Self-pay | Admitting: Family Medicine

## 2015-07-21 ENCOUNTER — Ambulatory Visit (INDEPENDENT_AMBULATORY_CARE_PROVIDER_SITE_OTHER): Payer: BLUE CROSS/BLUE SHIELD | Admitting: Family Medicine

## 2015-07-21 VITALS — BP 144/89 | HR 78 | Temp 98.6°F | Ht 64.0 in | Wt 172.4 lb

## 2015-07-21 DIAGNOSIS — J0101 Acute recurrent maxillary sinusitis: Secondary | ICD-10-CM | POA: Diagnosis not present

## 2015-07-21 MED ORDER — HYDROCODONE-HOMATROPINE 5-1.5 MG/5ML PO SYRP: 5.0000 mL | ORAL_SOLUTION | Freq: Three times a day (TID) | ORAL | Status: DC | PRN

## 2015-07-21 MED ORDER — AMOXICILLIN 875 MG PO TABS: 875.0000 mg | ORAL_TABLET | Freq: Two times a day (BID) | ORAL | Status: DC

## 2015-07-21 NOTE — Progress Notes (Signed)
   Subjective:    Patient ID: Kara Cortez, female    DOB: 1950-10-16, 64 y.o.   MRN: [id number]  HPI 64 year old with a 4 day history of cough congestion. Began as a sore throat. She has been using over-the-counter Alka-Seltzer, DayQuil and NyQuil. She denies fever. The cough is largely nonproductive and her congestion seems to be more in her sinuses and head than in her chest at this time.    Review of Systems  Constitutional: Negative.   HENT: Positive for congestion.   Respiratory: Positive for cough.   Cardiovascular: Negative.        Patient Active Problem List   Diagnosis Date Noted  . BMI 30.0-30.9,adult 06/17/2015  . Type 2 diabetes mellitus without complication ([place]) 42/59/5638  . Essential hypertension 11/30/2014   Outpatient Encounter Prescriptions as of 07/21/2015  Medication Sig  . lisinopril (PRINIVIL,ZESTRIL) 20 MG tablet Take 1 tablet (20 mg total) by mouth daily.  . metFORMIN (GLUCOPHAGE) 500 MG tablet Take 1 tablet (500 mg total) by mouth 2 (two) times daily with a meal.  . amoxicillin (AMOXIL) 875 MG tablet Take 1 tablet (875 mg total) by mouth 2 (two) times daily.  Marland Kitchen HYDROcodone-homatropine (HYCODAN) 5-1.5 MG/5ML syrup Take 5 mLs by mouth every 8 (eight) hours as needed for cough.   No facility-administered encounter medications on file as of 07/21/2015.    Objective:   Physical Exam  Constitutional: She appears well-developed and well-nourished.  HENT:  Right Ear: External ear normal.  Left Ear: External ear normal.  Nose: Nose normal.  Mouth/Throat: Oropharynx is clear and moist.  There is tenderness in the maxillary area to percussion  Cardiovascular: Normal rate and regular rhythm.   Pulmonary/Chest: Effort normal and breath sounds normal.          Assessment & Plan:  1. Acute recurrent maxillary sinusitis Will treat with amoxicillin. Continue DayQuil and NyQuil. I did give her some Hycodan if the cough gets worse for nighttime use.  Drink plenty of fluids  Wardell Honour MD

## 2015-07-27 ENCOUNTER — Telehealth: Payer: Self-pay | Admitting: Family Medicine

## 2015-07-27 ENCOUNTER — Encounter (INDEPENDENT_AMBULATORY_CARE_PROVIDER_SITE_OTHER): Payer: Self-pay

## 2015-07-27 ENCOUNTER — Ambulatory Visit (INDEPENDENT_AMBULATORY_CARE_PROVIDER_SITE_OTHER): Payer: BLUE CROSS/BLUE SHIELD

## 2015-07-27 ENCOUNTER — Encounter: Payer: Self-pay | Admitting: Family

## 2015-07-27 ENCOUNTER — Ambulatory Visit (INDEPENDENT_AMBULATORY_CARE_PROVIDER_SITE_OTHER): Payer: BLUE CROSS/BLUE SHIELD | Admitting: Family

## 2015-07-27 VITALS — BP 141/89 | HR 70 | Temp 98.1°F | Ht 64.0 in | Wt 172.0 lb

## 2015-07-27 DIAGNOSIS — J309 Allergic rhinitis, unspecified: Secondary | ICD-10-CM | POA: Diagnosis not present

## 2015-07-27 DIAGNOSIS — R059 Cough, unspecified: Secondary | ICD-10-CM

## 2015-07-27 DIAGNOSIS — R05 Cough: Secondary | ICD-10-CM

## 2015-07-27 DIAGNOSIS — J209 Acute bronchitis, unspecified: Secondary | ICD-10-CM | POA: Diagnosis not present

## 2015-07-27 IMAGING — CR DG CHEST 2V
2 series · 2 of 2 positions shown · non-contrast
Comparison: [DATE]

CLINICAL DATA: Cough

EXAM:
CHEST - 2 VIEW

[view not recorded (1 of 2)]
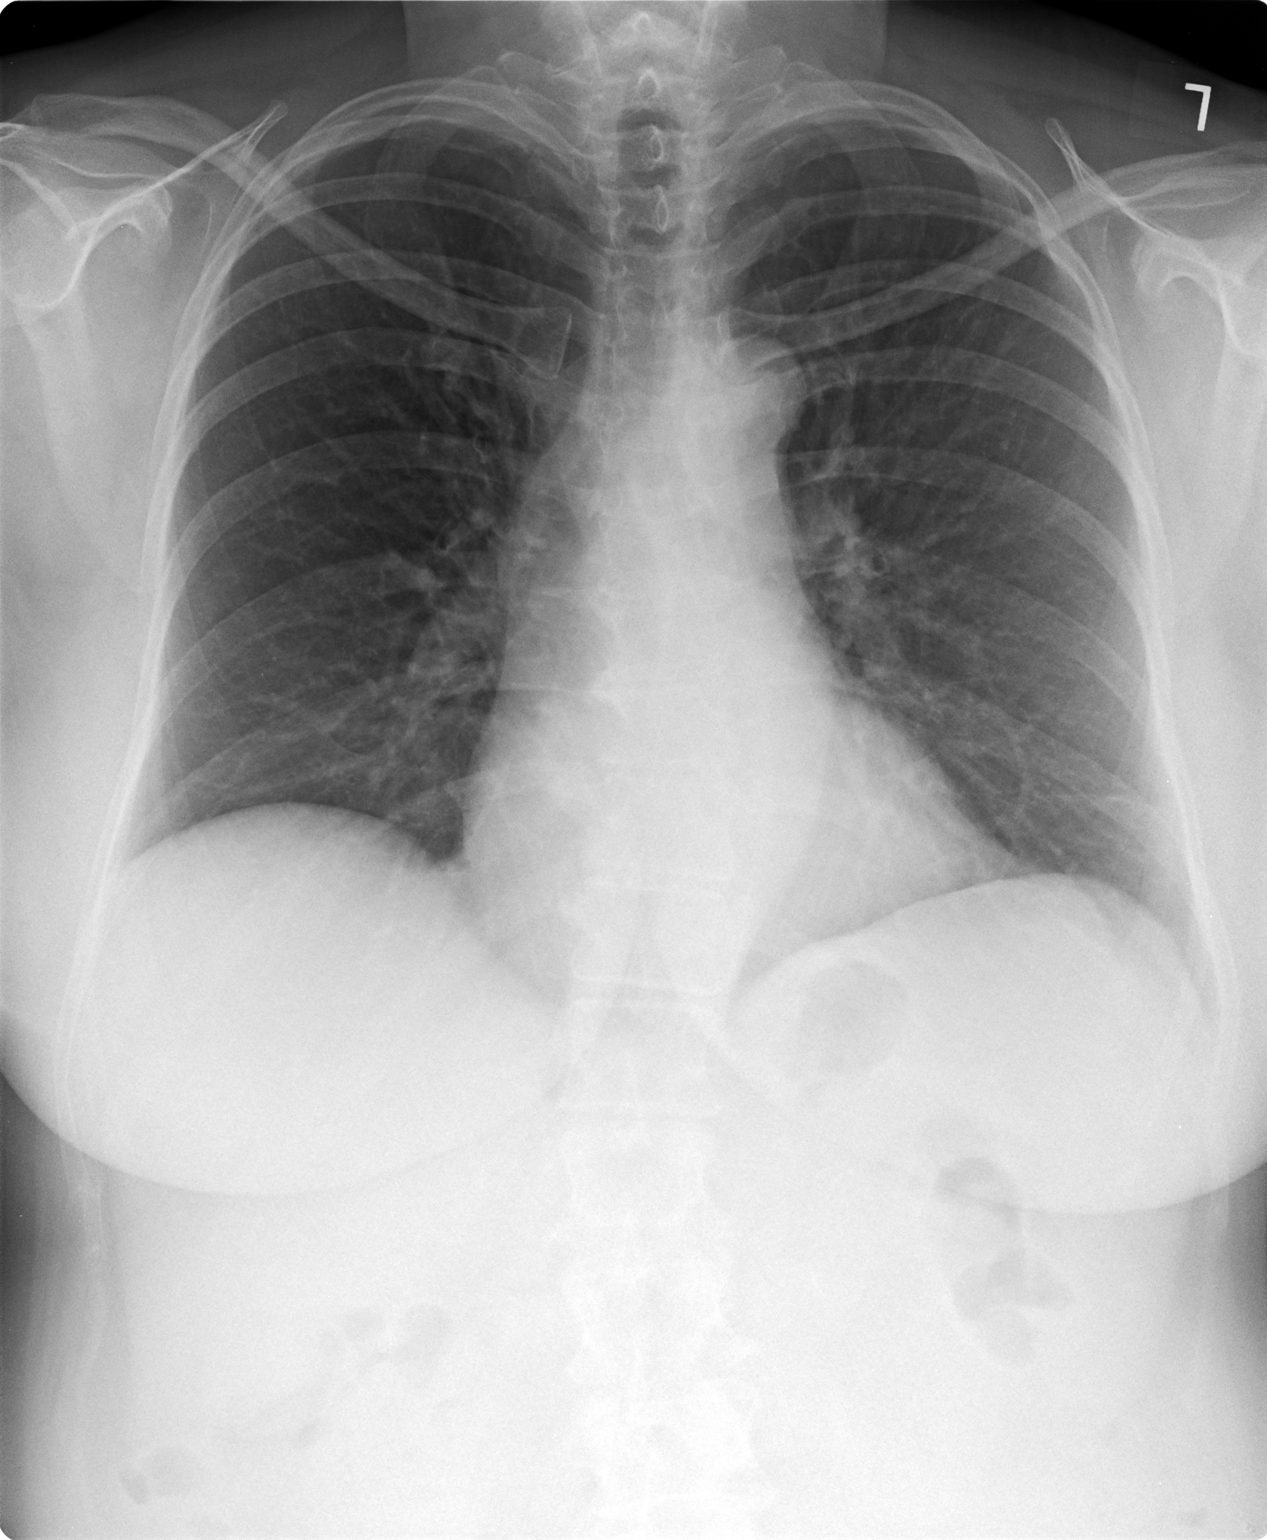

[view not recorded (2 of 2)]
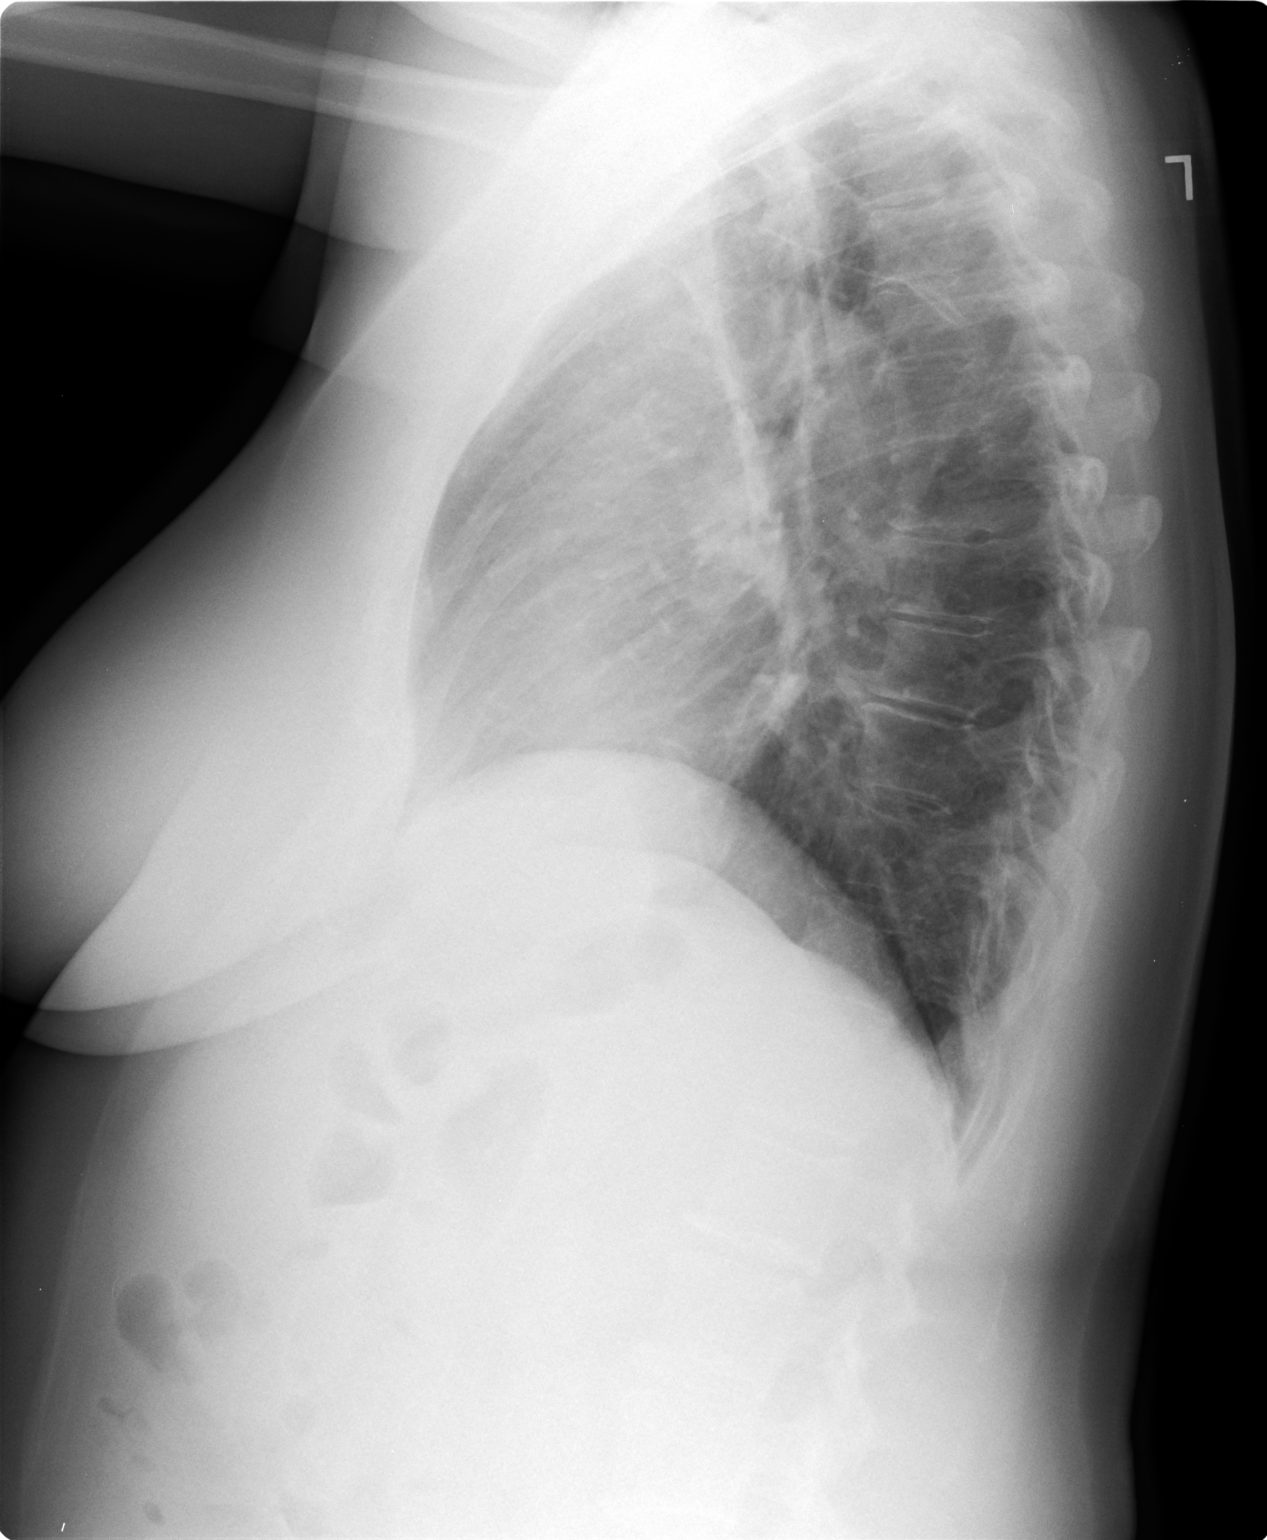

[2 of 2 positions shown; findings below may reference images not displayed]

FINDINGS: Cardiac shadow is within normal limits. The lungs are well aerated
bilaterally. No focal infiltrate or sizable effusion is seen
IMPRESSION: No active disease.

## 2015-07-27 MED ORDER — BENZONATATE 200 MG PO CAPS: 200.0000 mg | ORAL_CAPSULE | Freq: Three times a day (TID) | ORAL | Status: DC | PRN

## 2015-07-27 MED ORDER — FLUTICASONE PROPIONATE 50 MCG/ACT NA SUSP: 2.0000 | Freq: Every day | NASAL | Status: DC

## 2015-07-27 MED ORDER — PREDNISONE 20 MG PO TABS: ORAL_TABLET | ORAL | Status: DC

## 2015-07-27 MED ORDER — LEVOFLOXACIN 500 MG PO TABS: 500.0000 mg | ORAL_TABLET | Freq: Every day | ORAL | Status: DC

## 2015-07-27 NOTE — Patient Instructions (Signed)
Acute Bronchitis Bronchitis is inflammation of the airways that extend from the windpipe into the lungs (bronchi). The inflammation often causes mucus to develop. This leads to a cough, which is the most common symptom of bronchitis.  In acute bronchitis, the condition usually develops suddenly and goes away over time, usually in a couple weeks. Smoking, allergies, and asthma can make bronchitis worse. Repeated episodes of bronchitis may cause further lung problems.  CAUSES Acute bronchitis is most often caused by the same virus that causes a cold. The virus can spread from person to person (contagious) through coughing, sneezing, and touching contaminated objects. SIGNS AND SYMPTOMS   Cough.   Fever.   Coughing up mucus.   Body aches.   Chest congestion.   Chills.   Shortness of breath.   Sore throat.  DIAGNOSIS  Acute bronchitis is usually diagnosed through a physical exam. Your health care provider will also ask you questions about your medical history. Tests, such as chest X-rays, are sometimes done to rule out other conditions.  TREATMENT  Acute bronchitis usually goes away in a couple weeks. Oftentimes, no medical treatment is necessary. Medicines are sometimes given for relief of fever or cough. Antibiotic medicines are usually not needed but may be prescribed in certain situations. In some cases, an inhaler may be recommended to help reduce shortness of breath and control the cough. A cool mist vaporizer may also be used to help thin bronchial secretions and make it easier to clear the chest.  HOME CARE INSTRUCTIONS  Get plenty of rest.   Drink enough fluids to keep your urine clear or pale yellow (unless you have a medical condition that requires fluid restriction). Increasing fluids may help thin your respiratory secretions (sputum) and reduce chest congestion, and it will prevent dehydration.   Take medicines only as directed by your health care provider.  If  you were prescribed an antibiotic medicine, finish it all even if you start to feel better.  Avoid smoking and secondhand smoke. Exposure to cigarette smoke or irritating chemicals will make bronchitis worse. If you are a smoker, consider using nicotine gum or skin patches to help control withdrawal symptoms. Quitting smoking will help your lungs heal faster.   Reduce the chances of another bout of acute bronchitis by washing your hands frequently, avoiding people with cold symptoms, and trying not to touch your hands to your mouth, nose, or eyes.   Keep all follow-up visits as directed by your health care provider.  SEEK MEDICAL CARE IF: Your symptoms do not improve after 1 week of treatment.  SEEK IMMEDIATE MEDICAL CARE IF:  You develop an increased fever or chills.   You have chest pain.   You have severe shortness of breath.  You have bloody sputum.   You develop dehydration.  You faint or repeatedly feel like you are going to pass out.  You develop repeated vomiting.  You develop a severe headache. MAKE SURE YOU:   Understand these instructions.  Will watch your condition.  Will get help right away if you are not doing well or get worse.   This information is not intended to replace advice given to you by your health care provider. Make sure you discuss any questions you have with your health care provider.   Document Released: 10/26/2004 Document Revised: 10/09/2014 Document Reviewed: 03/11/2013 Elsevier Interactive Patient Education 2016 [employer]  - Take meds as prescribed - Use a cool mist humidifier  -Use saline nose sprays frequently -Saline   irrigations of the nose can be very helpful if done frequently.  * 4X daily for 1 week*  * Use of a nettie pot can be helpful with this. Follow directions with this* -Force fluids -For any cough or congestion  Use plain Mucinex- regular strength or max strength is fine   * Children- consult with Pharmacist for  dosing -For fever or aces or pains- take tylenol or ibuprofen appropriate for age and weight.  * for fevers greater than 101 orally you may alternate ibuprofen and tylenol every  3 hours. -Throat lozenges if help    Alben Jepsen, FNP  

## 2015-07-27 NOTE — Telephone Encounter (Signed)
Patient informed, patient coming in today at 4:30

## 2015-07-27 NOTE — Progress Notes (Signed)
Subjective:    Patient ID: Kara Cortez, female    DOB: [birth year], 64 y.o.   MRN: [id number]  Pt presents to the office for recurrent cough. Pt was seen in the office on 07/21/15 and given amoxicillin and hycodan cough syrup. Pt states the cough continues and she has developed a rash.  Rash This is a new problem. The current episode started today. The affected locations include the right arm, back, right lowerleg and left lower leg. The rash is characterized by redness and itchiness. She was exposed to a new medication. Associated symptoms include coughing and a sore throat. Pertinent negatives include no fever or shortness of breath. Past treatments include nothing. The treatment provided no relief. There is no history of asthma.  Cough This is a recurrent problem. The current episode started 1 to 4 weeks ago. The problem has been gradually worsening. The problem occurs every few minutes. The cough is non-productive. Associated symptoms include chills, nasal congestion, postnasal drip, a rash, a sore throat and wheezing. Pertinent negatives include no ear congestion, ear pain, fever, headaches or shortness of breath. The symptoms are aggravated by lying down. She has tried rest and prescription cough suppressant (amoxicillin) for the symptoms. The treatment provided mild relief. There is no history of asthma or COPD.      Review of Systems  Constitutional: Positive for chills. Negative for fever.  HENT: Positive for postnasal drip and sore throat. Negative for ear pain.   Eyes: Negative.   Respiratory: Positive for cough and wheezing. Negative for shortness of breath.   Cardiovascular: Negative.  Negative for palpitations.  Gastrointestinal: Negative.   Endocrine: Negative.   Genitourinary: Negative.   Musculoskeletal: Negative.   Skin: Positive for rash.  Neurological: Negative.  Negative for headaches.  Hematological: Negative.   Psychiatric/Behavioral: Negative.   All other  systems reviewed and are negative.      Objective:   Physical Exam  Constitutional: She is oriented to person, place, and time. She appears well-developed and well-nourished. No distress.  HENT:  Head: Normocephalic and atraumatic.  Right Ear: External ear normal.  Mouth/Throat: Oropharynx is clear and moist.  Eyes: Pupils are equal, round, and reactive to light.  Neck: Normal range of motion. Neck supple. No thyromegaly present.  Cardiovascular: Normal rate, regular rhythm, normal heart sounds and intact distal pulses.   No murmur heard. Pulmonary/Chest: Effort normal and breath sounds normal. No respiratory distress. She has no wheezes.  Abdominal: Soft. Bowel sounds are normal. She exhibits no distension. There is no tenderness.  Musculoskeletal: Normal range of motion. She exhibits no edema or tenderness.  Neurological: She is alert and oriented to person, place, and time. She has normal reflexes. No cranial nerve deficit.  Skin: Skin is warm and dry.  Psychiatric: She has a normal mood and affect. Her behavior is normal. Judgment and thought content normal.  Vitals reviewed.     BP 141/89 mmHg  Pulse 70  Temp(Src) 98.1 F (36.7 C) (Oral)  Ht 5\' 4"  (1.626 m)  Wt 172 lb (78.019 kg)  BMI 29.51 kg/m2     Assessment & Plan:  1. Cough - DG Chest 2 View; Future  2. Acute bronchitis, unspecified organism -- Take meds as prescribed - Use a cool mist humidifier  -Use saline nose sprays frequently -Saline irrigations of the nose can be very helpful if done frequently.  * 4X daily for 1 week*  * Use of a nettie pot can be helpful with this.  Follow directions with this* -Force fluids -For any cough or congestion  Use plain Mucinex- regular strength or max strength is fine   * Children- consult with Pharmacist for dosing -For fever or aces or pains- take tylenol or ibuprofen appropriate for age and weight.  * for fevers greater than 101 orally you may alternate ibuprofen and  tylenol every  3 hours. -Throat lozenges if help -Pt to be on low carb diet related to prednisone  - levofloxacin (LEVAQUIN) 500 MG tablet; Take 1 tablet (500 mg total) by mouth daily.  Dispense: 7 tablet; Refill: 0 - benzonatate (TESSALON) 200 MG capsule; Take 1 capsule (200 mg total) by mouth 3 (three) times daily as needed.  Dispense: 30 capsule; Refill: 1 - fluticasone (FLONASE) 50 MCG/ACT nasal spray; Place 2 sprays into both nostrils daily.  Dispense: 16 g; Refill: 6 - predniSONE (DELTASONE) 20 MG tablet; 2 po at same time daily for 5 days  Dispense: 10 tablet; Refill: 0  3. Allergic rhinitis, unspecified allergic rhinitis type - fluticasone (FLONASE) 50 MCG/ACT nasal spray; Place 2 sprays into both nostrils daily.  Dispense: 16 g; Refill: [place], FNP

## 2015-07-27 NOTE — Telephone Encounter (Signed)
Patient is still having cough, nasal congestion, raspy voice.  She is still taking the Amoxicillin and Hycodan, wants to know if we can call in something else, perhaps Prednisone dose pack to [facility].

## 2015-07-27 NOTE — Telephone Encounter (Signed)
This patient should be given an appointment for follow-up since she is not better

## 2015-08-03 ENCOUNTER — Encounter (INDEPENDENT_AMBULATORY_CARE_PROVIDER_SITE_OTHER): Payer: Self-pay

## 2015-08-03 ENCOUNTER — Ambulatory Visit (INDEPENDENT_AMBULATORY_CARE_PROVIDER_SITE_OTHER): Payer: BLUE CROSS/BLUE SHIELD | Admitting: Family

## 2015-08-03 ENCOUNTER — Encounter: Payer: Self-pay | Admitting: Family

## 2015-08-03 VITALS — BP 139/99 | HR 69 | Temp 97.9°F | Ht 64.0 in | Wt 163.0 lb

## 2015-08-03 DIAGNOSIS — J309 Allergic rhinitis, unspecified: Secondary | ICD-10-CM | POA: Diagnosis not present

## 2015-08-03 DIAGNOSIS — I10 Essential (primary) hypertension: Secondary | ICD-10-CM

## 2015-08-03 MED ORDER — LOSARTAN POTASSIUM 50 MG PO TABS: 50.0000 mg | ORAL_TABLET | Freq: Every day | ORAL | Status: DC

## 2015-08-03 NOTE — Patient Instructions (Signed)
Hypertension Hypertension, commonly called high blood pressure, is when the force of blood pumping through your arteries is too strong. Your arteries are the blood vessels that carry blood from your heart throughout your body. A blood pressure reading consists of a higher number over a lower number, such as 110/72. The higher number (systolic) is the pressure inside your arteries when your heart pumps. The lower number (diastolic) is the pressure inside your arteries when your heart relaxes. Ideally you want your blood pressure below 120/80. Hypertension forces your heart to work harder to pump blood. Your arteries may become narrow or stiff. Having untreated or uncontrolled hypertension can cause heart attack, stroke, kidney disease, and other problems. RISK FACTORS Some risk factors for high blood pressure are controllable. Others are not.  Risk factors you cannot control include:   Race. You may be at higher risk if you are African American.  Age. Risk increases with age.  Gender. Men are at higher risk than women before age 45 years. After age 65, women are at higher risk than men. Risk factors you can control include:  Not getting enough exercise or physical activity.  Being overweight.  Getting too much fat, sugar, calories, or salt in your diet.  Drinking too much alcohol. SIGNS AND SYMPTOMS Hypertension does not usually cause signs or symptoms. Extremely high blood pressure (hypertensive crisis) may cause headache, anxiety, shortness of breath, and nosebleed. DIAGNOSIS To check if you have hypertension, your health care provider will measure your blood pressure while you are seated, with your arm held at the level of your heart. It should be measured at least twice using the same arm. Certain conditions can cause a difference in blood pressure between your right and left arms. A blood pressure reading that is higher than normal on one occasion does not mean that you need treatment. If  it is not clear whether you have high blood pressure, you may be asked to return on a different day to have your blood pressure checked again. Or, you may be asked to monitor your blood pressure at home for 1 or more weeks. TREATMENT Treating high blood pressure includes making lifestyle changes and possibly taking medicine. Living a healthy lifestyle can help lower high blood pressure. You may need to change some of your habits. Lifestyle changes may include:  Following the DASH diet. This diet is high in fruits, vegetables, and whole grains. It is low in salt, red meat, and added sugars.  Keep your sodium intake below 2,300 mg per day.  Getting at least 30-45 minutes of aerobic exercise at least 4 times per week.  Losing weight if necessary.  Not smoking.  Limiting alcoholic beverages.  Learning ways to reduce stress. Your health care provider may prescribe medicine if lifestyle changes are not enough to get your blood pressure under control, and if one of the following is true:  You are 18-59 years of age and your systolic blood pressure is above 140.  You are 60 years of age or older, and your systolic blood pressure is above 150.  Your diastolic blood pressure is above 90.  You have diabetes, and your systolic blood pressure is over 140 or your diastolic blood pressure is over 90.  You have kidney disease and your blood pressure is above 140/90.  You have heart disease and your blood pressure is above 140/90. Your personal target blood pressure may vary depending on your medical conditions, your age, and other factors. HOME CARE INSTRUCTIONS    Have your blood pressure rechecked as directed by your health care provider.   Take medicines only as directed by your health care provider. Follow the directions carefully. Blood pressure medicines must be taken as prescribed. The medicine does not work as well when you skip doses. Skipping doses also puts you at risk for  problems.  Do not smoke.   Monitor your blood pressure at home as directed by your health care provider. SEEK MEDICAL CARE IF:   You think you are having a reaction to medicines taken.  You have recurrent headaches or feel dizzy.  You have swelling in your ankles.  You have trouble with your vision. SEEK IMMEDIATE MEDICAL CARE IF:  You develop a severe headache or confusion.  You have unusual weakness, numbness, or feel faint.  You have severe chest or abdominal pain.  You vomit repeatedly.  You have trouble breathing. MAKE SURE YOU:   Understand these instructions.  Will watch your condition.  Will get help right away if you are not doing well or get worse.   This information is not intended to replace advice given to you by your health care provider. Make sure you discuss any questions you have with your health care provider.   Document Released: 09/18/2005 Document Revised: 02/02/2015 Document Reviewed: 07/11/2013 Elsevier Interactive Patient Education 2016 [employer] Inc.  

## 2015-08-03 NOTE — Progress Notes (Signed)
   Subjective:    Patient ID: Kara Cortez, female    DOB: 02-13-1951, 64 y.o.   MRN: [id number]  Cough This is a new problem. The current episode started 1 to 4 weeks ago. The problem has been rapidly improving. The problem occurs every few hours. The cough is non-productive. Pertinent negatives include no chills, ear congestion, ear pain, headaches, nasal congestion, shortness of breath or wheezing. The symptoms are aggravated by pollens. She has tried rest and oral steroids for the symptoms. The treatment provided significant relief. There is no history of asthma or COPD.      Review of Systems  Constitutional: Negative.  Negative for chills.  HENT: Negative.  Negative for ear pain.   Eyes: Negative.   Respiratory: Positive for cough. Negative for shortness of breath and wheezing.   Cardiovascular: Negative.  Negative for palpitations.  Gastrointestinal: Negative.   Endocrine: Negative.   Genitourinary: Negative.   Musculoskeletal: Negative.   Neurological: Negative.  Negative for headaches.  Hematological: Negative.   Psychiatric/Behavioral: Negative.   All other systems reviewed and are negative.      Objective:   Physical Exam  Constitutional: She is oriented to person, place, and time. She appears well-developed and well-nourished. No distress.  HENT:  Head: Normocephalic and atraumatic.  Right Ear: A middle ear effusion is present.  Left Ear: A middle ear effusion is present.  Nasal passage erythemas with mild swelling   Eyes: Pupils are equal, round, and reactive to light.  Neck: Normal range of motion. Neck supple. No thyromegaly present.  Cardiovascular: Normal rate, regular rhythm, normal heart sounds and intact distal pulses.   No murmur heard. Pulmonary/Chest: Effort normal and breath sounds normal. No respiratory distress. She has no wheezes.  Abdominal: Soft. Bowel sounds are normal. She exhibits no distension. There is no tenderness.  Musculoskeletal:  Normal range of motion. She exhibits no edema or tenderness.  Neurological: She is alert and oriented to person, place, and time. She has normal reflexes. No cranial nerve deficit.  Skin: Skin is warm and dry.  Psychiatric: She has a normal mood and affect. Her behavior is normal. Judgment and thought content normal.  Vitals reviewed.     BP 139/99 mmHg  Pulse 69  Temp(Src) 97.9 F (36.6 C) (Oral)  Ht 5' 4" (1.626 m)  Wt 163 lb (73.936 kg)  BMI 27.97 kg/m2     Assessment & Plan:  1. Essential hypertension -Pt's Lisinopril changed to Losartan- Pt states she never cough until starting the lisinopril -Dash diet information given -Exercise encouraged - Stress Management  -Continue current meds -RTO in 2 weeks  - BMP8+EGFR - losartan (COZAAR) 50 MG tablet; Take 1 tablet (50 mg total) by mouth daily.  Dispense: 90 tablet; Refill: 3  2. Allergic rhinitis, unspecified allergic rhinitis type -Continue flonase  Evelina Dun, FNP

## 2015-08-04 LAB — BMP8+EGFR
BUN / CREAT RATIO: 19 (ref 11–26)
BUN: 13 mg/dL (ref 8–27)
CHLORIDE: 98 mmol/L (ref 97–106)
CO2: 27 mmol/L (ref 18–29)
Calcium: 9.5 mg/dL (ref 8.7–10.3)
Creatinine, Ser: 0.69 mg/dL (ref 0.57–1.00)
GFR calc non Af Amer: 93 mL/min/{1.73_m2} (ref >59)
GFR, EST AFRICAN AMERICAN: 107 mL/min/{1.73_m2} (ref >59)
GLUCOSE: 117 mg/dL — AB (ref 65–99)
POTASSIUM: 4.1 mmol/L (ref 3.5–5.2)
SODIUM: 141 mmol/L (ref 136–144)

## 2015-08-10 ENCOUNTER — Encounter: Payer: Self-pay | Admitting: *Deleted

## 2015-08-17 ENCOUNTER — Encounter: Payer: Self-pay | Admitting: Family

## 2015-08-17 ENCOUNTER — Ambulatory Visit (INDEPENDENT_AMBULATORY_CARE_PROVIDER_SITE_OTHER): Payer: BLUE CROSS/BLUE SHIELD | Admitting: Family

## 2015-08-17 VITALS — BP 128/82 | HR 82 | Temp 98.0°F | Ht 64.0 in | Wt 168.2 lb

## 2015-08-17 DIAGNOSIS — J309 Allergic rhinitis, unspecified: Secondary | ICD-10-CM | POA: Diagnosis not present

## 2015-08-17 DIAGNOSIS — I10 Essential (primary) hypertension: Secondary | ICD-10-CM

## 2015-08-17 DIAGNOSIS — E119 Type 2 diabetes mellitus without complications: Secondary | ICD-10-CM

## 2015-08-17 MED ORDER — MONTELUKAST SODIUM 10 MG PO TABS: 10.0000 mg | ORAL_TABLET | Freq: Every day | ORAL | Status: DC

## 2015-08-17 NOTE — Progress Notes (Signed)
Subjective:    Patient ID: Kara Cortez, female    DOB: Dec 04, 1950, 64 y.o.   MRN: [id number]  Pt presents to the office today to recheck HTN. Pt's BP is at goal today! Hypertension This is a chronic problem. The current episode started more than 1 year ago. The problem is controlled. Pertinent negatives include no anxiety, headaches, palpitations, peripheral edema or shortness of breath. Risk factors for coronary artery disease include post-menopausal state, obesity, sedentary lifestyle and family history. Past treatments include angiotensin blockers. The current treatment provides significant improvement. There is no history of kidney disease, CAD/MI, CVA, heart failure or a thyroid problem.  Sinusitis This is a new problem. The current episode started yesterday. The problem has been waxing and waning since onset. There has been no fever. Her pain is at a severity of 0/10. She is experiencing no pain. Associated symptoms include congestion, coughing, ear pain, sinus pressure and sneezing. Pertinent negatives include no headaches, hoarse voice, shortness of breath or sore throat. Past treatments include spray decongestants. The treatment provided mild relief.  Cough Associated symptoms include ear pain. Pertinent negatives include no headaches, sore throat or shortness of breath.      Review of Systems  Constitutional: Negative.   HENT: Positive for congestion, ear pain, sinus pressure and sneezing. Negative for hoarse voice and sore throat.   Eyes: Negative.   Respiratory: Positive for cough. Negative for shortness of breath.   Cardiovascular: Negative.  Negative for palpitations.  Gastrointestinal: Negative.   Endocrine: Negative.   Genitourinary: Negative.   Musculoskeletal: Negative.   Neurological: Positive for dizziness. Negative for headaches.  Hematological: Negative.   Psychiatric/Behavioral: Negative.   All other systems reviewed and are negative.      Objective:     Physical Exam  Constitutional: She is oriented to person, place, and time. She appears well-developed and well-nourished. No distress.  HENT:  Head: Normocephalic and atraumatic.  Right Ear: External ear normal.  Mouth/Throat: Oropharynx is clear and moist.  Eyes: Pupils are equal, round, and reactive to light.  Neck: Normal range of motion. Neck supple. No thyromegaly present.  Cardiovascular: Normal rate, regular rhythm, normal heart sounds and intact distal pulses.   No murmur heard. Pulmonary/Chest: Effort normal and breath sounds normal. No respiratory distress. She has no wheezes.  Abdominal: Soft. Bowel sounds are normal. She exhibits no distension. There is no tenderness.  Musculoskeletal: Normal range of motion. She exhibits no edema or tenderness.  Neurological: She is alert and oriented to person, place, and time. She has normal reflexes. No cranial nerve deficit.  Skin: Skin is warm and dry.  Psychiatric: She has a normal mood and affect. Her behavior is normal. Judgment and thought content normal.  Vitals reviewed.     BP 128/82 mmHg  Pulse 82  Temp(Src) 98 F (36.7 C) (Oral)  Ht '5\' 4"'  (1.626 m)  Wt 168 lb 3.2 oz (76.295 kg)  BMI 28.86 kg/m2     Assessment & Plan:  1. Allergic rhinitis, unspecified allergic rhinitis type -Continue Flonase - montelukast (SINGULAIR) 10 MG tablet; Take 1 tablet (10 mg total) by mouth at bedtime.  Dispense: 30 tablet; Refill: 3 - BMP8+EGFR  2. Essential hypertension -Dash diet information given -Exercise encouraged - Stress Management  -Continue current meds -RTO in 3 months - BMP8+EGFR  3. Type 2 diabetes mellitus without complication, without long-term current use of insulin (HCC) -Low carb diet - Ambulatory referral to Ophthalmology - BMP8+EGFR   *Pt states  she will call and schedule her mammogram [place], FNP

## 2015-08-17 NOTE — Patient Instructions (Signed)
Allergic Rhinitis Allergic rhinitis is when the mucous membranes in the nose respond to allergens. Allergens are particles in the air that cause your body to have an allergic reaction. This causes you to release allergic antibodies. Through a chain of events, these eventually cause you to release histamine into the blood stream. Although meant to protect the body, it is this release of histamine that causes your discomfort, such as frequent sneezing, congestion, and an itchy, runny nose.  CAUSES Seasonal allergic rhinitis (hay fever) is caused by pollen allergens that may come from grasses, trees, and weeds. Year-round allergic rhinitis (perennial allergic rhinitis) is caused by allergens such as house dust mites, pet dander, and mold spores. SYMPTOMS  Nasal stuffiness (congestion).  Itchy, runny nose with sneezing and tearing of the eyes. DIAGNOSIS Your health care provider can help you determine the allergen or allergens that trigger your symptoms. If you and your health care provider are unable to determine the allergen, skin or blood testing may be used. Your health care provider will diagnose your condition after taking your health history and performing a physical exam. Your health care provider may assess you for other related conditions, such as asthma, pink eye, or an ear infection. TREATMENT Allergic rhinitis does not have a cure, but it can be controlled by:  Medicines that block allergy symptoms. These may include allergy shots, nasal sprays, and oral antihistamines.  Avoiding the allergen. Hay fever may often be treated with antihistamines in pill or nasal spray forms. Antihistamines block the effects of histamine. There are over-the-counter medicines that may help with nasal congestion and swelling around the eyes. Check with your health care provider before taking or giving this medicine. If avoiding the allergen or the medicine prescribed do not work, there are many new medicines  your health care provider can prescribe. Stronger medicine may be used if initial measures are ineffective. Desensitizing injections can be used if medicine and avoidance does not work. Desensitization is when a patient is given ongoing shots until the body becomes less sensitive to the allergen. Make sure you follow up with your health care provider if problems continue. HOME CARE INSTRUCTIONS It is not possible to completely avoid allergens, but you can reduce your symptoms by taking steps to limit your exposure to them. It helps to know exactly what you are allergic to so that you can avoid your specific triggers. SEEK MEDICAL CARE IF:  You have a fever.  You develop a cough that does not stop easily (persistent).  You have shortness of breath.  You start wheezing.  Symptoms interfere with normal daily activities.   This information is not intended to replace advice given to you by your health care provider. Make sure you discuss any questions you have with your health care provider.   Document Released: 06/13/2001 Document Revised: 10/09/2014 Document Reviewed: 05/26/2013 Elsevier Interactive Patient Education 2016 Elsevier Inc.  

## 2015-08-18 LAB — BMP8+EGFR
BUN / CREAT RATIO: 16 (ref 11–26)
BUN: 11 mg/dL (ref 8–27)
CALCIUM: 9.4 mg/dL (ref 8.7–10.3)
CHLORIDE: 100 mmol/L (ref 97–106)
CO2: 28 mmol/L (ref 18–29)
CREATININE: 0.68 mg/dL (ref 0.57–1.00)
GFR calc Af Amer: 108 mL/min/{1.73_m2} (ref >59)
GFR calc non Af Amer: 93 mL/min/{1.73_m2} (ref >59)
GLUCOSE: 197 mg/dL — AB (ref 65–99)
Potassium: 3.9 mmol/L (ref 3.5–5.2)
Sodium: 142 mmol/L (ref 136–144)

## 2015-11-09 ENCOUNTER — Ambulatory Visit: Payer: BLUE CROSS/BLUE SHIELD | Admitting: Nurse Practitioner

## 2015-11-10 ENCOUNTER — Encounter: Payer: Self-pay | Admitting: Family

## 2015-11-16 ENCOUNTER — Ambulatory Visit (INDEPENDENT_AMBULATORY_CARE_PROVIDER_SITE_OTHER): Payer: BLUE CROSS/BLUE SHIELD | Admitting: Nurse Practitioner

## 2015-11-16 ENCOUNTER — Encounter: Payer: Self-pay | Admitting: Nurse Practitioner

## 2015-11-16 VITALS — BP 138/95 | HR 66 | Temp 96.9°F | Ht 64.0 in | Wt 174.0 lb

## 2015-11-16 DIAGNOSIS — Z1212 Encounter for screening for malignant neoplasm of rectum: Secondary | ICD-10-CM

## 2015-11-16 DIAGNOSIS — I10 Essential (primary) hypertension: Secondary | ICD-10-CM

## 2015-11-16 DIAGNOSIS — E119 Type 2 diabetes mellitus without complications: Secondary | ICD-10-CM

## 2015-11-16 DIAGNOSIS — Z6829 Body mass index (BMI) 29.0-29.9, adult: Secondary | ICD-10-CM | POA: Diagnosis not present

## 2015-11-16 DIAGNOSIS — Z1159 Encounter for screening for other viral diseases: Secondary | ICD-10-CM

## 2015-11-16 LAB — POCT GLYCOSYLATED HEMOGLOBIN (HGB A1C): HEMOGLOBIN A1C: 9.2

## 2015-11-16 MED ORDER — METFORMIN HCL 1000 MG PO TABS: 1000.0000 mg | ORAL_TABLET | Freq: Two times a day (BID) | ORAL | Status: DC

## 2015-11-16 NOTE — Progress Notes (Signed)
Subjective:    Patient ID: Kara Cortez, female    DOB: 02-25-1951, 65 y.o.   MRN: [id number]   Patient here today for follow up of chronic medical problems.  Outpatient Encounter Prescriptions as of 11/16/2015  Medication Sig  . aspirin 81 MG tablet Take 81 mg by mouth daily.  Marland Kitchen losartan (COZAAR) 50 MG tablet Take 1 tablet (50 mg total) by mouth daily.  . metFORMIN (GLUCOPHAGE) 500 MG tablet Take 1 tablet (500 mg total) by mouth 2 (two) times daily with a meal.  . fluticasone (FLONASE) 50 MCG/ACT nasal spray Place 2 sprays into both nostrils daily. (Patient not taking: Reported on 11/16/2015)  . [DISCONTINUED] montelukast (SINGULAIR) 10 MG tablet Take 1 tablet (10 mg total) by mouth at bedtime.   No facility-administered encounter medications on file as of 11/16/2015.    Hypertension This is a new problem. The current episode started more than 1 month ago. The problem has been gradually improving since onset. The problem is controlled. Pertinent negatives include no blurred vision, chest pain, headaches, orthopnea, palpitations, peripheral edema or shortness of breath. Risk factors for coronary artery disease include sedentary lifestyle, stress, family history and post-menopausal state. Past treatments include ACE inhibitors. There are no compliance problems.   Diabetes She presents for her follow-up (hgba1c was 8.3% at last visit.) diabetic visit. She has type 2 diabetes mellitus. Her disease course has been stable. Pertinent negatives for hypoglycemia include no dizziness, headaches, nervousness/anxiousness or tremors. Pertinent negatives for diabetes include no blurred vision and no chest pain. There are no hypoglycemic complications. There are no diabetic complications. Risk factors for coronary artery disease include dyslipidemia, hypertension and post-menopausal. Current diabetic treatment includes oral agent (monotherapy) (doesn;t take metformin every day due to diarrhea.). She is  compliant with treatment all of the time. Her weight is stable. When asked about meal planning, she reported none. She has not had a previous visit with a dietitian. She rarely participates in exercise. There is no change in her home blood glucose trend. Her breakfast blood glucose is taken between 9-10 am. Her breakfast blood glucose range is generally 140-180 mg/dl. An ACE inhibitor/angiotensin II receptor blocker is being taken. She does not see a podiatrist.Eye exam is not current.      Review of Systems  Constitutional: Negative.   HENT: Negative.   Eyes: Negative for blurred vision.  Respiratory: Negative for shortness of breath.   Cardiovascular: Negative for chest pain, palpitations and orthopnea.  Neurological: Negative for dizziness, tremors and headaches.  Psychiatric/Behavioral: Negative.  The patient is not nervous/anxious.   All other systems reviewed and are negative.      Objective:   Physical Exam  Constitutional: She is oriented to person, place, and time. She appears well-developed and well-nourished.  HENT:  Nose: Nose normal.  Mouth/Throat: Oropharynx is clear and moist.  Eyes: EOM are normal.  Neck: Trachea normal, normal range of motion and full passive range of motion without pain. Neck supple. No JVD present. Carotid bruit is not present. No thyromegaly present.  Cardiovascular: Normal rate, regular rhythm, normal heart sounds and intact distal pulses.  Exam reveals no gallop and no friction rub.   No murmur heard. Pulmonary/Chest: Effort normal and breath sounds normal.  Abdominal: Soft. Bowel sounds are normal. She exhibits no distension and no mass. There is no tenderness.  Musculoskeletal: Normal range of motion.  Lymphadenopathy:    She has no cervical adenopathy.  Neurological: She is alert and oriented to  person, place, and time. She has normal reflexes.  Skin: Skin is warm and dry.  Psychiatric: She has a normal mood and affect. Her behavior is  normal. Judgment and thought content normal.   BP 138/95 mmHg  Pulse 66  Temp(Src) 96.9 F (36.1 C) (Oral)  Ht '5\' 4"'  (1.626 m)  Wt 174 lb (78.926 kg)  BMI 29.85 kg/m2  Results for orders placed or performed in visit on 11/16/15  POCT glycosylated hemoglobin (Hb A1C)  Result Value Ref Range   Hemoglobin A1C 9.2          Assessment & Plan:  1. Essential hypertension Do not add salt to diet - CMP14+EGFR - Vitamin B12 - VITAMIN D 25 Hydroxy (Vit-D Deficiency, Fractures)  2. Type 2 diabetes mellitus without complication, without long-term current use of insulin (HCC) Stricter carb counting Increased metformin dose formm 500 BID to 1000 BID - POCT glycosylated hemoglobin (Hb A1C) - Lipid panel - metFORMIN (GLUCOPHAGE) 1000 MG tablet; Take 1 tablet (1,000 mg total) by mouth 2 (two) times daily with a meal.  Dispense: 180 tablet; Refill: 1  3. BMI 29.0-29.9,adult Discussed diet and exercise for person with BMI >25 Will recheck weight in 3-6 months  4. Screening for malignant neoplasm of the rectum - Fecal occult blood, imunochemical; Future  5. Need for hepatitis C screening test - Hepatitis C antibody   Encouraged to have eye exam Labs pending Health maintenance reviewed Diet and exercise encouraged Continue all meds Follow up  In 3 months   [place], FNP

## 2015-11-16 NOTE — Patient Instructions (Signed)

## 2015-11-17 LAB — LIPID PANEL
CHOLESTEROL TOTAL: 206 mg/dL — AB (ref 100–199)
Chol/HDL Ratio: 3.2 ratio units (ref 0.0–4.4)
HDL: 64 mg/dL (ref >39)
LDL Calculated: 114 mg/dL — ABNORMAL HIGH (ref 0–99)
Triglycerides: 138 mg/dL (ref 0–149)
VLDL CHOLESTEROL CAL: 28 mg/dL (ref 5–40)

## 2015-11-17 LAB — CMP14+EGFR
A/G RATIO: 1.8 (ref 1.1–2.5)
ALBUMIN: 4.2 g/dL (ref 3.6–4.8)
ALK PHOS: 98 IU/L (ref 39–117)
ALT: 15 IU/L (ref 0–32)
AST: 12 IU/L (ref 0–40)
BILIRUBIN TOTAL: 0.3 mg/dL (ref 0.0–1.2)
BUN / CREAT RATIO: 15 (ref 11–26)
BUN: 11 mg/dL (ref 8–27)
CALCIUM: 9.5 mg/dL (ref 8.7–10.3)
CHLORIDE: 98 mmol/L (ref 96–106)
CO2: 25 mmol/L (ref 18–29)
Creatinine, Ser: 0.75 mg/dL (ref 0.57–1.00)
GFR calc Af Amer: 98 mL/min/{1.73_m2} (ref >59)
GFR calc non Af Amer: 85 mL/min/{1.73_m2} (ref >59)
GLOBULIN, TOTAL: 2.4 g/dL (ref 1.5–4.5)
Glucose: 114 mg/dL — ABNORMAL HIGH (ref 65–99)
POTASSIUM: 3.7 mmol/L (ref 3.5–5.2)
SODIUM: 139 mmol/L (ref 134–144)
TOTAL PROTEIN: 6.6 g/dL (ref 6.0–8.5)

## 2015-11-29 NOTE — Progress Notes (Signed)
Lmtcb/ww 2/27

## 2015-11-30 LAB — HM DIABETES EYE EXAM

## 2016-02-25 ENCOUNTER — Ambulatory Visit (INDEPENDENT_AMBULATORY_CARE_PROVIDER_SITE_OTHER): Payer: BLUE CROSS/BLUE SHIELD | Admitting: Nurse Practitioner

## 2016-02-25 ENCOUNTER — Encounter: Payer: Self-pay | Admitting: Nurse Practitioner

## 2016-02-25 VITALS — BP 124/82 | HR 70 | Temp 97.0°F | Ht 64.0 in | Wt 174.0 lb

## 2016-02-25 DIAGNOSIS — Z23 Encounter for immunization: Secondary | ICD-10-CM

## 2016-02-25 DIAGNOSIS — Z1159 Encounter for screening for other viral diseases: Secondary | ICD-10-CM

## 2016-02-25 DIAGNOSIS — Z1212 Encounter for screening for malignant neoplasm of rectum: Secondary | ICD-10-CM | POA: Diagnosis not present

## 2016-02-25 DIAGNOSIS — E119 Type 2 diabetes mellitus without complications: Secondary | ICD-10-CM | POA: Diagnosis not present

## 2016-02-25 DIAGNOSIS — I10 Essential (primary) hypertension: Secondary | ICD-10-CM | POA: Diagnosis not present

## 2016-02-25 DIAGNOSIS — J309 Allergic rhinitis, unspecified: Secondary | ICD-10-CM | POA: Diagnosis not present

## 2016-02-25 DIAGNOSIS — Z6829 Body mass index (BMI) 29.0-29.9, adult: Secondary | ICD-10-CM | POA: Diagnosis not present

## 2016-02-25 LAB — BAYER DCA HB A1C WAIVED: HB A1C: 7.4 % — AB (ref <7.0)

## 2016-02-25 MED ORDER — LOSARTAN POTASSIUM 50 MG PO TABS: 50.0000 mg | ORAL_TABLET | Freq: Every day | ORAL | Status: DC

## 2016-02-25 MED ORDER — METFORMIN HCL 1000 MG PO TABS: 1000.0000 mg | ORAL_TABLET | Freq: Two times a day (BID) | ORAL | Status: DC

## 2016-02-25 NOTE — Addendum Note (Signed)
Addended by: Rolena Infante on: 02/25/2016 04:47 PM   Modules accepted: Orders

## 2016-02-25 NOTE — Patient Instructions (Signed)
Diabetes and Foot Care Diabetes may cause you to have problems because of poor blood supply (circulation) to your feet and legs. This may cause the skin on your feet to become thinner, break easier, and heal more slowly. Your skin may become dry, and the skin may peel and crack. You may also have nerve damage in your legs and feet causing decreased feeling in them. You may not notice minor injuries to your feet that could lead to infections or more serious problems. Taking care of your feet is one of the most important things you can do for yourself.  HOME CARE INSTRUCTIONS  Wear shoes at all times, even in the house. Do not go barefoot. Bare feet are easily injured.  Check your feet daily for blisters, cuts, and redness. If you cannot see the bottom of your feet, use a mirror or ask someone for help.  Wash your feet with warm water (do not use hot water) and mild soap. Then pat your feet and the areas between your toes until they are completely dry. Do not soak your feet as this can dry your skin.  Apply a moisturizing lotion or petroleum jelly (that does not contain alcohol and is unscented) to the skin on your feet and to dry, brittle toenails. Do not apply lotion between your toes.  Trim your toenails straight across. Do not dig under them or around the cuticle. File the edges of your nails with an emery board or nail file.  Do not cut corns or calluses or try to remove them with medicine.  Wear clean socks or stockings every day. Make sure they are not too tight. Do not wear knee-high stockings since they may decrease blood flow to your legs.  Wear shoes that fit properly and have enough cushioning. To break in new shoes, wear them for just a few hours a day. This prevents you from injuring your feet. Always look in your shoes before you put them on to be sure there are no objects inside.  Do not cross your legs. This may decrease the blood flow to your feet.  If you find a minor scrape,  cut, or break in the skin on your feet, keep it and the skin around it clean and dry. These areas may be cleansed with mild soap and water. Do not cleanse the area with peroxide, alcohol, or iodine.  When you remove an adhesive bandage, be sure not to damage the skin around it.  If you have a wound, look at it several times a day to make sure it is healing.  Do not use heating pads or hot water bottles. They may burn your skin. If you have lost feeling in your feet or legs, you may not know it is happening until it is too late.  Make sure your health care provider performs a complete foot exam at least annually or more often if you have foot problems. Report any cuts, sores, or bruises to your health care provider immediately. SEEK MEDICAL CARE IF:   You have an injury that is not healing.  You have cuts or breaks in the skin.  You have an ingrown nail.  You notice redness on your legs or feet.  You feel burning or tingling in your legs or feet.  You have pain or cramps in your legs and feet.  Your legs or feet are numb.  Your feet always feel cold. SEEK IMMEDIATE MEDICAL CARE IF:   There is increasing redness,   swelling, or pain in or around a wound.  There is a red line that goes up your leg.  Pus is coming from a wound.  You develop a fever or as directed by your health care provider.  You notice a bad smell coming from an ulcer or wound.   This information is not intended to replace advice given to you by your health care provider. Make sure you discuss any questions you have with your health care provider.   Document Released: 09/15/2000 Document Revised: 05/21/2013 Document Reviewed: 02/25/2013 Elsevier Interactive Patient Education 2016 Elsevier Inc.  

## 2016-02-25 NOTE — Progress Notes (Signed)
Subjective:    Patient ID: Kara Cortez, female    DOB: 24-Mar-1951, 65 y.o.   MRN: [id number]   Patient here today for follow up of chronic medical problems.  Outpatient Encounter Prescriptions as of 02/25/2016  Medication Sig  . aspirin 81 MG tablet Take 81 mg by mouth daily.  . fluticasone (FLONASE) 50 MCG/ACT nasal spray Place 2 sprays into both nostrils daily. (Patient not taking: Reported on 11/16/2015)  . losartan (COZAAR) 50 MG tablet Take 1 tablet (50 mg total) by mouth daily.  . metFORMIN (GLUCOPHAGE) 1000 MG tablet Take 1 tablet (1,000 mg total) by mouth 2 (two) times daily with a meal.   No facility-administered encounter medications on file as of 02/25/2016.    Hypertension This is a new problem. The current episode started more than 1 month ago. The problem has been gradually improving since onset. The problem is controlled. Pertinent negatives include no blurred vision, chest pain, headaches, orthopnea, palpitations, peripheral edema or shortness of breath. Risk factors for coronary artery disease include sedentary lifestyle, stress, family history and post-menopausal state. Past treatments include ACE inhibitors. There are no compliance problems.   Diabetes She presents for her follow-up (hgba1c was 8.3% at last visit.) diabetic visit. She has type 2 diabetes mellitus. Her disease course has been stable. Pertinent negatives for hypoglycemia include no dizziness, headaches, nervousness/anxiousness or tremors. Pertinent negatives for diabetes include no blurred vision and no chest pain. There are no hypoglycemic complications. There are no diabetic complications. Risk factors for coronary artery disease include dyslipidemia, hypertension and post-menopausal. Current diabetic treatment includes oral agent (monotherapy) (doesn;t take metformin every day due to diarrhea.). She is compliant with treatment all of the time. Her weight is stable. When asked about meal planning, she  reported none. She has not had a previous visit with a dietitian. She rarely participates in exercise. There is no change in her home blood glucose trend. Her breakfast blood glucose is taken between 9-10 am. Her breakfast blood glucose range is generally 140-180 mg/dl. An ACE inhibitor/angiotensin II receptor blocker is being taken. She does not see a podiatrist.Eye exam is not current.      Review of Systems  Constitutional: Negative.   HENT: Negative.   Eyes: Negative for blurred vision.  Respiratory: Negative for shortness of breath.   Cardiovascular: Negative for chest pain, palpitations and orthopnea.  Neurological: Negative for dizziness, tremors and headaches.  Psychiatric/Behavioral: Negative.  The patient is not nervous/anxious.   All other systems reviewed and are negative.      Objective:   Physical Exam  Constitutional: She is oriented to person, place, and time. She appears well-developed and well-nourished.  HENT:  Nose: Nose normal.  Mouth/Throat: Oropharynx is clear and moist.  Eyes: EOM are normal.  Neck: Trachea normal, normal range of motion and full passive range of motion without pain. Neck supple. No JVD present. Carotid bruit is not present. No thyromegaly present.  Cardiovascular: Normal rate, regular rhythm, normal heart sounds and intact distal pulses.  Exam reveals no gallop and no friction rub.   No murmur heard. Pulmonary/Chest: Effort normal and breath sounds normal.  Abdominal: Soft. Bowel sounds are normal. She exhibits no distension and no mass. There is no tenderness.  Musculoskeletal: Normal range of motion.  Lymphadenopathy:    She has no cervical adenopathy.  Neurological: She is alert and oriented to person, place, and time. She has normal reflexes.  Skin: Skin is warm and dry.  Psychiatric: She  has a normal mood and affect. Her behavior is normal. Judgment and thought content normal.    BP 124/82 mmHg  Pulse 70  Temp(Src) 97 F (36.1 C)  (Oral)  Ht _0  (1.626 m)  Wt 174 lb (78.926 kg)  BMI 29.85 kg/m2   hgba1c 7.4%      Assessment & Plan:  1. Essential hypertension Do not add salt to diet - CMP14+EGFR - losartan (COZAAR) 50 MG tablet; Take 1 tablet (50 mg total) by mouth daily.  Dispense: 90 tablet; Refill: 3  2. Type 2 diabetes mellitus without complication, without long-term current use of insulin (HCC) Continue to watch carbs in diet - Bayer DCA Hb A1c Waived - metFORMIN (GLUCOPHAGE) 1000 MG tablet; Take 1 tablet (1,000 mg total) by mouth 2 (two) times daily with a meal.  Dispense: 180 tablet; Refill: 1  3. BMI 29.0-29.9,adult Discussed diet and exercise for person with BMI >25 Will recheck weight in 3-6 months - Lipid panel  4. Allergic rhinitis, unspecified allergic rhinitis type Continue flonase as rx  5. Screening for malignant neoplasm of the rectum - Fecal occult blood, imunochemical; Future  6. Need for hepatitis C screening test - Hepatitis C antibody    Labs pending Health maintenance reviewed Diet and exercise encouraged Continue all meds Follow up  In 3 months   [place], FNP

## 2016-02-26 LAB — CMP14+EGFR
ALT: 15 IU/L (ref 0–32)
AST: 13 IU/L (ref 0–40)
Albumin/Globulin Ratio: 1.8 (ref 1.2–2.2)
Albumin: 4.4 g/dL (ref 3.6–4.8)
Alkaline Phosphatase: 81 IU/L (ref 39–117)
BILIRUBIN TOTAL: 0.4 mg/dL (ref 0.0–1.2)
BUN/Creatinine Ratio: 22 (ref 12–28)
BUN: 17 mg/dL (ref 8–27)
CALCIUM: 10 mg/dL (ref 8.7–10.3)
CO2: 24 mmol/L (ref 18–29)
Chloride: 100 mmol/L (ref 96–106)
Creatinine, Ser: 0.79 mg/dL (ref 0.57–1.00)
GFR, EST AFRICAN AMERICAN: 92 mL/min/{1.73_m2} (ref >59)
GFR, EST NON AFRICAN AMERICAN: 80 mL/min/{1.73_m2} (ref >59)
GLUCOSE: 84 mg/dL (ref 65–99)
Globulin, Total: 2.5 g/dL (ref 1.5–4.5)
Potassium: 3.9 mmol/L (ref 3.5–5.2)
Sodium: 140 mmol/L (ref 134–144)
TOTAL PROTEIN: 6.9 g/dL (ref 6.0–8.5)

## 2016-02-26 LAB — LIPID PANEL
CHOL/HDL RATIO: 2.7 ratio (ref 0.0–4.4)
Cholesterol, Total: 166 mg/dL (ref 100–199)
HDL: 62 mg/dL (ref >39)
LDL Calculated: 78 mg/dL (ref 0–99)
Triglycerides: 132 mg/dL (ref 0–149)
VLDL CHOLESTEROL CAL: 26 mg/dL (ref 5–40)

## 2016-02-26 LAB — HEPATITIS C ANTIBODY

## 2016-04-11 ENCOUNTER — Encounter: Payer: Self-pay | Admitting: *Deleted

## 2016-04-17 ENCOUNTER — Telehealth: Payer: Self-pay | Admitting: Nurse Practitioner

## 2016-05-19 ENCOUNTER — Telehealth: Payer: Self-pay | Admitting: Nurse Practitioner

## 2016-05-19 NOTE — Telephone Encounter (Signed)
Please review FYI

## 2016-05-22 ENCOUNTER — Encounter (INDEPENDENT_AMBULATORY_CARE_PROVIDER_SITE_OTHER): Payer: Self-pay

## 2016-05-22 ENCOUNTER — Encounter: Payer: Self-pay | Admitting: Nurse Practitioner

## 2016-05-22 ENCOUNTER — Ambulatory Visit (INDEPENDENT_AMBULATORY_CARE_PROVIDER_SITE_OTHER): Payer: BLUE CROSS/BLUE SHIELD | Admitting: Nurse Practitioner

## 2016-05-22 VITALS — BP 136/86 | HR 72 | Temp 97.7°F | Ht 64.0 in | Wt 172.0 lb

## 2016-05-22 DIAGNOSIS — E119 Type 2 diabetes mellitus without complications: Secondary | ICD-10-CM

## 2016-05-22 DIAGNOSIS — K591 Functional diarrhea: Secondary | ICD-10-CM | POA: Diagnosis not present

## 2016-05-22 MED ORDER — SITAGLIPTIN PHOSPHATE 100 MG PO TABS: 100.0000 mg | ORAL_TABLET | Freq: Every day | ORAL | 0 refills | Status: DC

## 2016-05-22 MED ORDER — METFORMIN HCL 500 MG PO TABS: 500.0000 mg | ORAL_TABLET | Freq: Two times a day (BID) | ORAL | 0 refills | Status: DC

## 2016-05-22 NOTE — Telephone Encounter (Signed)
Pt notified. appt scheduled.

## 2016-05-22 NOTE — Progress Notes (Signed)
   Subjective:    Patient ID: Kara Cortez, female    DOB: [birth year], 65 y.o.   MRN: [id number]  HPI Patient in to discuss metformin. SHe has been taking 1000mg  of metformin for awhile- She says it causes diarrhea- has to go 3-4 x a day several times a week- sometimes she does not always make it to the bathroom in time and will go in her pants. SHe does not check her blood sugars. Her last HGBA1c was 7.4%.  She has no other complaints today.    Review of Systems  Constitutional: Negative.   HENT: Negative.   Respiratory: Negative.   Cardiovascular: Negative.   Genitourinary: Negative.   Neurological: Negative.   Psychiatric/Behavioral: Negative.   All other systems reviewed and are negative.      Objective:   Physical Exam  Constitutional: She is oriented to person, place, and time. She appears well-developed and well-nourished.  Cardiovascular: Normal rate, regular rhythm and normal heart sounds.   Pulmonary/Chest: Effort normal and breath sounds normal.  Neurological: She is alert and oriented to person, place, and time.  Skin: Skin is warm and dry.  Psychiatric: She has a normal mood and affect. Her behavior is normal. Judgment and thought content normal.    BP 136/86 (BP Location: Right Arm, Cuff Size: Normal)   Pulse 72   Temp 97.7 F (36.5 C) (Oral)   Ht 5\' 4"  (1.626 m)   Wt 172 lb (78 kg)   BMI 29.52 kg/m          Assessment & Plan:   1. Type 2 diabetes mellitus without complication, without long-term current use of insulin ([place])   2. Functional diarrhea    Meds ordered this encounter  Medications  . metFORMIN (GLUCOPHAGE) 500 MG tablet    Sig: Take 1 tablet (500 mg total) by mouth 2 (two) times daily with a meal.    Dispense:  40 tablet    Refill:  0    Order Specific Question:   Supervising Provider    Answer:   VINCENT, CAROL L [[id number]]  . sitaGLIPtin (JANUVIA) 100 MG tablet    Sig: Take 1 tablet (100 mg total) by mouth daily.    Dispense:  28  tablet    Refill:  0    Order Specific Question:   Supervising Provider    Answer:   Eustaquio Maize [[id number]]   Stop metformin- decrease to 500mg  BID- she will break her current ones in half januvia samples given 1 po qd Watch diet Follow up in 1 month  [place], FNP

## 2016-05-22 NOTE — Telephone Encounter (Signed)
She needs to be seen to change meds

## 2016-05-30 ENCOUNTER — Telehealth: Payer: Self-pay | Admitting: Nurse Practitioner

## 2016-05-30 NOTE — Telephone Encounter (Signed)
Spoke to pt and advised Januvia usually doesn't cause a cough and that it could be just a cold, sinus congestion etc. Pt voiced understanding. Advised to monitor and try otc medications for the cough and to call back if it doesn't get better or worsens to schedule appt for evaluation. Pt voiced understanding.

## 2016-05-30 NOTE — Telephone Encounter (Signed)
Celesta Gentile should not cause a cough

## 2016-06-13 ENCOUNTER — Encounter: Payer: Self-pay | Admitting: Nurse Practitioner

## 2016-06-13 ENCOUNTER — Ambulatory Visit (INDEPENDENT_AMBULATORY_CARE_PROVIDER_SITE_OTHER): Payer: BLUE CROSS/BLUE SHIELD | Admitting: Nurse Practitioner

## 2016-06-13 VITALS — BP 135/82 | HR 79 | Temp 97.4°F | Ht 64.0 in | Wt 172.0 lb

## 2016-06-13 DIAGNOSIS — I10 Essential (primary) hypertension: Secondary | ICD-10-CM

## 2016-06-13 DIAGNOSIS — E119 Type 2 diabetes mellitus without complications: Secondary | ICD-10-CM

## 2016-06-13 DIAGNOSIS — Z6829 Body mass index (BMI) 29.0-29.9, adult: Secondary | ICD-10-CM

## 2016-06-13 LAB — BAYER DCA HB A1C WAIVED: HB A1C: 7.5 % — AB (ref <7.0)

## 2016-06-13 MED ORDER — SITAGLIPTIN PHOS-METFORMIN HCL 50-500 MG PO TABS: 1.0000 | ORAL_TABLET | Freq: Two times a day (BID) | ORAL | 1 refills | Status: DC

## 2016-06-13 NOTE — Patient Instructions (Signed)

## 2016-06-13 NOTE — Progress Notes (Signed)
Subjective:    Patient ID: Kara Cortez, female    DOB: May 27, 1951, 65 y.o.   MRN: [id number]   Patient here today for follow up of chronic medical problems.  Outpatient Encounter Prescriptions as of 06/13/2016  Medication Sig  . aspirin 81 MG tablet Take 81 mg by mouth daily.  Marland Kitchen losartan (COZAAR) 50 MG tablet Take 1 tablet (50 mg total) by mouth daily.  . metFORMIN (GLUCOPHAGE) 500 MG tablet Take 1 tablet (500 mg total) by mouth 2 (two) times daily with a meal.  . sitaGLIPtin (JANUVIA) 100 MG tablet Take 1 tablet (100 mg total) by mouth daily.   No facility-administered encounter medications on file as of 06/13/2016.     Hypertension  This is a new problem. The current episode started more than 1 month ago. The problem has been gradually improving since onset. The problem is controlled. Pertinent negatives include no blurred vision, chest pain, headaches, orthopnea, palpitations, peripheral edema or shortness of breath. Risk factors for coronary artery disease include sedentary lifestyle, stress, family history and post-menopausal state. Past treatments include ACE inhibitors. There are no compliance problems.   Diabetes  She presents for her follow-up (hgba1c was 8.3% at last visit.) diabetic visit. She has type 2 diabetes mellitus. Her disease course has been stable. Pertinent negatives for hypoglycemia include no dizziness, headaches, nervousness/anxiousness or tremors. Pertinent negatives for diabetes include no blurred vision and no chest pain. There are no hypoglycemic complications. There are no diabetic complications. Risk factors for coronary artery disease include dyslipidemia, hypertension and post-menopausal. Current diabetic treatment includes oral agent (monotherapy) and oral agent (dual therapy) (patient was changed to janumet several weeks ago because metformin was causing diarrhea.). She is compliant with treatment all of the time. Her weight is stable. When asked about  meal planning, she reported none. She has not had a previous visit with a dietitian. She rarely participates in exercise. Home blood sugar record trend: patient has not been checking blood sugars since med change because she has had bronchitis and just has nit felt like it. An ACE inhibitor/angiotensin II receptor blocker is being taken. She does not see a podiatrist.Eye exam is not current.      Review of Systems  Constitutional: Negative.   HENT: Negative.   Eyes: Negative for blurred vision.  Respiratory: Negative for shortness of breath.   Cardiovascular: Negative for chest pain, palpitations and orthopnea.  Neurological: Negative for dizziness, tremors and headaches.  Psychiatric/Behavioral: Negative.  The patient is not nervous/anxious.   All other systems reviewed and are negative.      Objective:   Physical Exam  Constitutional: She is oriented to person, place, and time. She appears well-developed and well-nourished.  HENT:  Nose: Nose normal.  Mouth/Throat: Oropharynx is clear and moist.  Eyes: EOM are normal.  Neck: Trachea normal, normal range of motion and full passive range of motion without pain. Neck supple. No JVD present. Carotid bruit is not present. No thyromegaly present.  Cardiovascular: Normal rate, regular rhythm, normal heart sounds and intact distal pulses.  Exam reveals no gallop and no friction rub.   No murmur heard. Pulmonary/Chest: Effort normal and breath sounds normal.  Abdominal: Soft. Bowel sounds are normal. She exhibits no distension and no mass. There is no tenderness.  Musculoskeletal: Normal range of motion.  Lymphadenopathy:    She has no cervical adenopathy.  Neurological: She is alert and oriented to person, place, and time. She has normal reflexes.  Skin: Skin  is warm and dry.  Psychiatric: She has a normal mood and affect. Her behavior is normal. Judgment and thought content normal.    BP 135/82   Pulse 79   Temp 97.4 F (36.3 C)  (Oral)   Ht _0  (1.626 m)   Wt 172 lb (78 kg)   BMI 29.52 kg/m   Hgba1c 7.5- up from 7.4% from last visit     Assessment & Plan:  1. Type 2 diabetes mellitus without complication, without long-term current use of insulin (HCC) Stricter carb counting - Bayer DCA Hb A1c Waived - Lipid panel - sitaGLIPtin-metformin (JANUMET) 50-500 MG tablet; Take 1 tablet by mouth 2 (two) times daily with a meal.  Dispense: 180 tablet; Refill: 1  2. Essential hypertension Do not add salt to diet - CMP14+EGFR  3. BMI 29.0-29.9,adult Discussed diet and exercise for person with BMI >25 Will recheck weight in 3-6 months    Labs pending Health maintenance reviewed Diet and exercise encouraged Continue all meds Follow up  In 3 months   [place], FNP

## 2016-06-14 LAB — CMP14+EGFR
A/G RATIO: 1.9 (ref 1.2–2.2)
ALT: 12 IU/L (ref 0–32)
AST: 9 IU/L (ref 0–40)
Albumin: 4.1 g/dL (ref 3.6–4.8)
Alkaline Phosphatase: 89 IU/L (ref 39–117)
BUN/Creatinine Ratio: 21 (ref 12–28)
BUN: 17 mg/dL (ref 8–27)
Bilirubin Total: 0.2 mg/dL (ref 0.0–1.2)
CALCIUM: 9.5 mg/dL (ref 8.7–10.3)
CO2: 25 mmol/L (ref 18–29)
Chloride: 97 mmol/L (ref 96–106)
Creatinine, Ser: 0.8 mg/dL (ref 0.57–1.00)
GFR calc Af Amer: 90 mL/min/{1.73_m2} (ref >59)
GFR, EST NON AFRICAN AMERICAN: 78 mL/min/{1.73_m2} (ref >59)
GLUCOSE: 188 mg/dL — AB (ref 65–99)
Globulin, Total: 2.2 g/dL (ref 1.5–4.5)
POTASSIUM: 4 mmol/L (ref 3.5–5.2)
Sodium: 140 mmol/L (ref 134–144)
Total Protein: 6.3 g/dL (ref 6.0–8.5)

## 2016-06-14 LAB — LIPID PANEL
CHOL/HDL RATIO: 2.6 ratio (ref 0.0–4.4)
Cholesterol, Total: 167 mg/dL (ref 100–199)
HDL: 65 mg/dL (ref >39)
LDL Calculated: 36 mg/dL (ref 0–99)
TRIGLYCERIDES: 332 mg/dL — AB (ref 0–149)
VLDL CHOLESTEROL CAL: 66 mg/dL — AB (ref 5–40)

## 2016-06-15 ENCOUNTER — Other Ambulatory Visit: Payer: Self-pay | Admitting: Nurse Practitioner

## 2016-06-15 ENCOUNTER — Telehealth: Payer: Self-pay

## 2016-06-15 MED ORDER — SAXAGLIPTIN-METFORMIN ER 5-1000 MG PO TB24: 1.0000 | ORAL_TABLET | Freq: Every day | ORAL | 1 refills | Status: DC

## 2016-06-15 NOTE — Telephone Encounter (Signed)
Left detailed message regarding provider feedback and advised to call back with any further questions or concerns.

## 2016-06-15 NOTE — Telephone Encounter (Signed)
Please elt patient know insurance would not pay for janumet- had to do another drug in same class- Kombyglize- only needs to take it 1 x per day- rx sent to pharmacy

## 2016-09-15 ENCOUNTER — Ambulatory Visit: Payer: BLUE CROSS/BLUE SHIELD | Admitting: Nurse Practitioner

## 2016-09-28 ENCOUNTER — Encounter: Payer: Self-pay | Admitting: Nurse Practitioner

## 2016-09-28 ENCOUNTER — Ambulatory Visit (INDEPENDENT_AMBULATORY_CARE_PROVIDER_SITE_OTHER): Payer: BLUE CROSS/BLUE SHIELD | Admitting: Nurse Practitioner

## 2016-09-28 ENCOUNTER — Encounter (INDEPENDENT_AMBULATORY_CARE_PROVIDER_SITE_OTHER): Payer: Self-pay

## 2016-09-28 VITALS — BP 130/90 | HR 64 | Temp 97.0°F | Ht 64.0 in | Wt 176.0 lb

## 2016-09-28 DIAGNOSIS — E119 Type 2 diabetes mellitus without complications: Secondary | ICD-10-CM

## 2016-09-28 DIAGNOSIS — I10 Essential (primary) hypertension: Secondary | ICD-10-CM

## 2016-09-28 DIAGNOSIS — Z6829 Body mass index (BMI) 29.0-29.9, adult: Secondary | ICD-10-CM

## 2016-09-28 LAB — BAYER DCA HB A1C WAIVED: HB A1C (BAYER DCA - WAIVED): 7.8 % — ABNORMAL HIGH (ref <7.0)

## 2016-09-28 MED ORDER — ONETOUCH DELICA LANCETS FINE MISC: 3 refills | Status: DC

## 2016-09-28 NOTE — Patient Instructions (Signed)
Carbohydrate Counting for Diabetes Mellitus, Adult Carbohydrate counting is a method for keeping track of how many carbohydrates you eat. Eating carbohydrates naturally increases the amount of sugar (glucose) in the blood. Counting how many carbohydrates you eat helps keep your blood glucose within normal limits, which helps you manage your diabetes (diabetes mellitus). It is important to know how many carbohydrates you can safely have in each meal. This is different for every person. A diet and nutrition specialist (registered dietitian) can help you make a meal plan and calculate how many carbohydrates you should have at each meal and snack. Carbohydrates are found in the following foods:  Grains, such as breads and cereals.  Dried beans and soy products.  Starchy vegetables, such as potatoes, peas, and corn.  Fruit and fruit juices.  Milk and yogurt.  Sweets and snack foods, such as cake, cookies, candy, chips, and soft drinks. How do I count carbohydrates? There are two ways to count carbohydrates in food. You can use either of the methods or a combination of both. Reading "Nutrition Facts" on packaged food  The "Nutrition Facts" list is included on the labels of almost all packaged foods and beverages in the [place] It includes:  The serving size.  Information about nutrients in each serving, including the grams (g) of carbohydrate per serving. To use the "Nutrition Facts":  Decide how many servings you will have.  Multiply the number of servings by the number of carbohydrates per serving.  The resulting number is the total amount of carbohydrates that you will be having. Learning standard serving sizes of other foods  When you eat foods containing carbohydrates that are not packaged or do not include "Nutrition Facts" on the label, you need to measure the servings in order to count the amount of carbohydrates:  Measure the foods that you will eat with a food scale or measuring  cup, if needed.  Decide how many standard-size servings you will eat.  Multiply the number of servings by 15. Most carbohydrate-rich foods have about 15 g of carbohydrates per serving.  For example, if you eat 8 oz (170 g) of strawberries, you will have eaten 2 servings and 30 g of carbohydrates (2 servings x 15 g = 30 g).  For foods that have more than one food mixed, such as soups and casseroles, you must count the carbohydrates in each food that is included. The following list contains standard serving sizes of common carbohydrate-rich foods. Each of these servings has about 15 g of carbohydrates:   hamburger bun or  English muffin.   oz (15 mL) syrup.   oz (14 g) jelly.  1 slice of bread.  1 six-inch tortilla.  3 oz (85 g) cooked rice or pasta.  4 oz (113 g) cooked dried beans.  4 oz (113 g) starchy vegetable, such as peas, corn, or potatoes.  4 oz (113 g) hot cereal.  4 oz (113 g) mashed potatoes or  of a large baked potato.  4 oz (113 g) canned or frozen fruit.  4 oz (120 mL) fruit juice.  4-6 crackers.  6 chicken nuggets.  6 oz (170 g) unsweetened dry cereal.  6 oz (170 g) plain fat-free yogurt or yogurt sweetened with artificial sweeteners.  8 oz (240 mL) milk.  8 oz (170 g) fresh fruit or one small piece of fruit.  24 oz (680 g) popped popcorn. Example of carbohydrate counting Sample meal  3 oz (85 g) chicken breast.  6 oz (  170 g) brown rice.  4 oz (113 g) corn.  8 oz (240 mL) milk.  8 oz (170 g) strawberries with sugar-free whipped topping. Carbohydrate calculation 1. Identify the foods that contain carbohydrates:  Rice.  Corn.  Milk.  Strawberries. 2. Calculate how many servings you have of each food:  2 servings rice.  1 serving corn.  1 serving milk.  1 serving strawberries. 3. Multiply each number of servings by 15 g:  2 servings rice x 15 g = 30 g.  1 serving corn x 15 g = 15 g.  1 serving milk x 15 g = 15  g.  1 serving strawberries x 15 g = 15 g. 4. Add together all of the amounts to find the total grams of carbohydrates eaten:  30 g + 15 g + 15 g + 15 g = 75 g of carbohydrates total. This information is not intended to replace advice given to you by your health care provider. Make sure you discuss any questions you have with your health care provider. Document Released: 09/18/2005 Document Revised: 04/07/2016 Document Reviewed: 03/01/2016 Elsevier Interactive Patient Education  2017 [employer] Inc.  

## 2016-09-28 NOTE — Progress Notes (Signed)
Subjective:    Patient ID: Kara Cortez, female    DOB: 19-Jun-1951, 65 y.o.   MRN: [id number]   Patient here today for follow up of chronic medical problems. No chnages since last visit. No complaints today.  Outpatient Encounter Prescriptions as of 09/28/2016  Medication Sig  . aspirin 81 MG tablet Take 81 mg by mouth daily.  Marland Kitchen losartan (COZAAR) 50 MG tablet Take 1 tablet (50 mg total) by mouth daily.  . Saxagliptin-Metformin (KOMBIGLYZE XR) 01-999 MG TB24 Take 1 tablet by mouth daily.   No facility-administered encounter medications on file as of 09/28/2016.     Hypertension  This is a new problem. The current episode started more than 1 month ago. The problem has been gradually improving since onset. The problem is controlled. Pertinent negatives include no blurred vision, chest pain, headaches, orthopnea, palpitations, peripheral edema or shortness of breath. Risk factors for coronary artery disease include sedentary lifestyle, stress, family history and post-menopausal state. Past treatments include ACE inhibitors. There are no compliance problems.   Diabetes  She presents for her follow-up (hgba1c was 8.3% at last visit.) diabetic visit. She has type 2 diabetes mellitus. Her disease course has been stable. Pertinent negatives for hypoglycemia include no dizziness, headaches, nervousness/anxiousness or tremors. Pertinent negatives for diabetes include no blurred vision and no chest pain. There are no hypoglycemic complications. There are no diabetic complications. Risk factors for coronary artery disease include dyslipidemia, hypertension and post-menopausal. Current diabetic treatment includes oral agent (monotherapy) and oral agent (dual therapy) (patient was changed to janumet several weeks ago because metformin was causing diarrhea.). She is compliant with treatment all of the time. Her weight is stable. When asked about meal planning, she reported none. She has not had a  previous visit with a dietitian. She rarely participates in exercise. Home blood sugar record trend: patient has not been checking blood sugars since med change because she has had bronchitis and just has nit felt like it. An ACE inhibitor/angiotensin II receptor blocker is being taken. She does not see a podiatrist.Eye exam is not current.      Review of Systems  Constitutional: Negative.   HENT: Negative.   Eyes: Negative for blurred vision.  Respiratory: Negative for shortness of breath.   Cardiovascular: Negative for chest pain, palpitations and orthopnea.  Neurological: Negative for dizziness, tremors and headaches.  Psychiatric/Behavioral: Negative.  The patient is not nervous/anxious.   All other systems reviewed and are negative.      Objective:   Physical Exam  Constitutional: She is oriented to person, place, and time. She appears well-developed and well-nourished.  HENT:  Nose: Nose normal.  Mouth/Throat: Oropharynx is clear and moist.  Eyes: EOM are normal.  Neck: Trachea normal, normal range of motion and full passive range of motion without pain. Neck supple. No JVD present. Carotid bruit is not present. No thyromegaly present.  Cardiovascular: Normal rate, regular rhythm, normal heart sounds and intact distal pulses.  Exam reveals no gallop and no friction rub.   No murmur heard. Pulmonary/Chest: Effort normal and breath sounds normal.  Abdominal: Soft. Bowel sounds are normal. She exhibits no distension and no mass. There is no tenderness.  Musculoskeletal: Normal range of motion.  Lymphadenopathy:    She has no cervical adenopathy.  Neurological: She is alert and oriented to person, place, and time. She has normal reflexes.  Skin: Skin is warm and dry.  Psychiatric: She has a normal mood and affect. Her behavior is  normal. Judgment and thought content normal.    BP 130/90 (BP Location: Left Arm, Cuff Size: Normal)   Pulse 64   Temp 97 F (36.1 C) (Oral)   Ht  '5\' 4"'  (1.626 m)   Wt 176 lb (79.8 kg)   BMI 30.21 kg/m     Hgba1c 7.8% up from 7.5%     Assessment & Plan:  1. Type 2 diabetes mellitus without complication, without long-term current use of insulin (HCC) Not going to make changes today Gave patient glucometer and she will keep diary of blood sygars Strict carb counting - Bayer DCA Hb A1c Waived - Lipid panel - Microalbumin / creatinine urine ratio - ONETOUCH DELICA LANCETS FINE MISC; Test 1 x a day and prn  Dx E11.9  Dispense: 100 each; Refill: 3  2. Essential hypertension Low sodium diet - CMP14+EGFR  3. BMI 29.0-29.9,adult Discussed diet and exercise for person with BMI >25 Will recheck weight in 3-6 months  Patient will schedule mammo and PAP hemoccult cards given to patient with directions Labs pending Health maintenance reviewed Diet and exercise encouraged Continue all meds Follow up  In 3 months   [place], FNP

## 2016-09-29 LAB — CMP14+EGFR
ALBUMIN: 4.5 g/dL (ref 3.6–4.8)
ALT: 17 IU/L (ref 0–32)
AST: 13 IU/L (ref 0–40)
Albumin/Globulin Ratio: 1.6 (ref 1.2–2.2)
Alkaline Phosphatase: 95 IU/L (ref 39–117)
BUN / CREAT RATIO: 20 (ref 12–28)
BUN: 17 mg/dL (ref 8–27)
Bilirubin Total: 0.3 mg/dL (ref 0.0–1.2)
CO2: 28 mmol/L (ref 18–29)
CREATININE: 0.87 mg/dL (ref 0.57–1.00)
Calcium: 9.4 mg/dL (ref 8.7–10.3)
Chloride: 99 mmol/L (ref 96–106)
GFR calc non Af Amer: 71 mL/min/{1.73_m2} (ref >59)
GFR, EST AFRICAN AMERICAN: 81 mL/min/{1.73_m2} (ref >59)
GLUCOSE: 125 mg/dL — AB (ref 65–99)
Globulin, Total: 2.8 g/dL (ref 1.5–4.5)
Potassium: 4 mmol/L (ref 3.5–5.2)
Sodium: 141 mmol/L (ref 134–144)
TOTAL PROTEIN: 7.3 g/dL (ref 6.0–8.5)

## 2016-09-29 LAB — LIPID PANEL
CHOLESTEROL TOTAL: 216 mg/dL — AB (ref 100–199)
Chol/HDL Ratio: 3 ratio units (ref 0.0–4.4)
HDL: 71 mg/dL (ref >39)
LDL CALC: 106 mg/dL — AB (ref 0–99)
Triglycerides: 196 mg/dL — ABNORMAL HIGH (ref 0–149)
VLDL CHOLESTEROL CAL: 39 mg/dL (ref 5–40)

## 2016-12-28 ENCOUNTER — Other Ambulatory Visit: Payer: BLUE CROSS/BLUE SHIELD | Admitting: Nurse Practitioner

## 2017-01-05 ENCOUNTER — Ambulatory Visit (INDEPENDENT_AMBULATORY_CARE_PROVIDER_SITE_OTHER): Payer: BLUE CROSS/BLUE SHIELD | Admitting: Nurse Practitioner

## 2017-01-05 ENCOUNTER — Encounter: Payer: Self-pay | Admitting: Nurse Practitioner

## 2017-01-05 VITALS — BP 127/83 | HR 72 | Temp 97.4°F | Ht 64.0 in | Wt 177.0 lb

## 2017-01-05 DIAGNOSIS — Z0000 Encounter for general adult medical examination without abnormal findings: Secondary | ICD-10-CM

## 2017-01-05 DIAGNOSIS — Z6829 Body mass index (BMI) 29.0-29.9, adult: Secondary | ICD-10-CM

## 2017-01-05 DIAGNOSIS — E119 Type 2 diabetes mellitus without complications: Secondary | ICD-10-CM

## 2017-01-05 DIAGNOSIS — Z01419 Encounter for gynecological examination (general) (routine) without abnormal findings: Secondary | ICD-10-CM | POA: Diagnosis not present

## 2017-01-05 DIAGNOSIS — I10 Essential (primary) hypertension: Secondary | ICD-10-CM

## 2017-01-05 LAB — URINALYSIS, COMPLETE
Bilirubin, UA: NEGATIVE
Glucose, UA: NEGATIVE
Ketones, UA: NEGATIVE
Nitrite, UA: NEGATIVE
PH UA: 5 (ref 5.0–7.5)
PROTEIN UA: NEGATIVE
Specific Gravity, UA: <1.005 — ABNORMAL LOW (ref 1.005–1.030)
UUROB: 0.2 mg/dL (ref 0.2–1.0)

## 2017-01-05 LAB — MICROSCOPIC EXAMINATION: Renal Epithel, UA: NONE SEEN /hpf

## 2017-01-05 LAB — BAYER DCA HB A1C WAIVED: HB A1C: 8.7 % — AB (ref <7.0)

## 2017-01-05 MED ORDER — SAXAGLIPTIN-METFORMIN ER 5-1000 MG PO TB24: 1.0000 | ORAL_TABLET | Freq: Every day | ORAL | 1 refills | Status: DC

## 2017-01-05 MED ORDER — GLIPIZIDE 5 MG PO TABS: 5.0000 mg | ORAL_TABLET | Freq: Two times a day (BID) | ORAL | 3 refills | Status: DC

## 2017-01-05 MED ORDER — LOSARTAN POTASSIUM 50 MG PO TABS: 50.0000 mg | ORAL_TABLET | Freq: Every day | ORAL | 3 refills | Status: DC

## 2017-01-05 NOTE — Progress Notes (Signed)
Subjective:    Patient ID: Kara Cortez, female    DOB: [birth year], 66 y.o.   MRN: [id number]  HPI  Kara Cortez is here today for follow up of chronic medical problem.  Outpatient Encounter Prescriptions as of 01/05/2017  Medication Sig  . aspirin 81 MG tablet Take 81 mg by mouth daily.  Marland Kitchen losartan (COZAAR) 50 MG tablet Take 1 tablet (50 mg total) by mouth daily.  Glory Rosebush DELICA LANCETS FINE MISC Test 1 x a day and prn  Dx E11.9  . Saxagliptin-Metformin (KOMBIGLYZE XR) 01-999 MG TB24 Take 1 tablet by mouth daily.   No facility-administered encounter medications on file as of 01/05/2017.     1. Annual physical exam  Need for annual exam  2. Gynecologic exam normal  Needs PAP  3. Essential hypertension  Low sodium diet  4. Type 2 diabetes mellitus without complication, without long-term current use of insulin ([place])  Patient does not check blood sugars at home  5. BMI 29.0-29.9,adult  No recent weight changes    New complaints: None today     Review of Systems  Constitutional: Negative for diaphoresis.  Eyes: Negative for pain.  Respiratory: Negative for shortness of breath.   Cardiovascular: Negative for chest pain, palpitations and leg swelling.  Gastrointestinal: Negative for abdominal pain.  Endocrine: Negative for polydipsia.  Skin: Negative for rash.  Neurological: Negative for dizziness, weakness and headaches.  Hematological: Does not bruise/bleed easily.       Objective:   Physical Exam  Constitutional: She is oriented to person, place, and time. She appears well-developed and well-nourished.  HENT:  Head: Normocephalic.  Right Ear: Hearing, tympanic membrane, external ear and ear canal normal.  Left Ear: Hearing, tympanic membrane, external ear and ear canal normal.  Nose: Nose normal.  Mouth/Throat: Uvula is midline and oropharynx is clear and moist.  Eyes: Conjunctivae and EOM are normal. Pupils are equal, round, and reactive to light.   Neck: Normal range of motion and full passive range of motion without pain. Neck supple. No JVD present. Carotid bruit is not present. No thyroid mass and no thyromegaly present.  Cardiovascular: Normal rate, normal heart sounds and intact distal pulses.   No murmur heard. Pulmonary/Chest: Effort normal and breath sounds normal. Right breast exhibits no inverted nipple, no mass, no nipple discharge, no skin change and no tenderness. Left breast exhibits no inverted nipple, no mass, no nipple discharge, no skin change and no tenderness.  Abdominal: Soft. Bowel sounds are normal. She exhibits no mass. There is no tenderness.  Genitourinary: Vagina normal and uterus normal. No breast swelling, tenderness, discharge or bleeding. No vaginal discharge found.  Genitourinary Comments: bimanual exam-No adnexal masses or tenderness. Cervix pink and stenosed  Musculoskeletal: Normal range of motion.  Lymphadenopathy:    She has no cervical adenopathy.  Neurological: She is alert and oriented to person, place, and time.  Skin: Skin is warm and dry.  Psychiatric: She has a normal mood and affect. Her behavior is normal. Judgment and thought content normal.   BP 127/83   Pulse 72   Temp 97.4 F (36.3 C) (Oral)   Ht '5\' 4"'  (1.626 m)   Wt 177 lb (80.3 kg)   BMI 30.38 kg/m   hgba1c 8.7%     Assessment & Plan:  1. Annual physical exam - Urinalysis, Complete - CBC with Differential/Platelet - Thyroid Panel With TSH  2. Gynecologic exam normal - IGP, Aptima HPV, rfx 16/18,45  3. Essential hypertension Low sodium diet - CMP14+EGFR - losartan (COZAAR) 50 MG tablet; Take 1 tablet (50 mg total) by mouth daily.  Dispense: 90 tablet; Refill: 3  4. Type 2 diabetes mellitus without complication, without long-term current use of insulin (HCC) Need to check fasting blood sugars Added glipizide to meds - Lipid panel - Microalbumin / creatinine urine ratio - Bayer DCA Hb A1c Waived -  Saxagliptin-Metformin (KOMBIGLYZE XR) 01-999 MG TB24; Take 1 tablet by mouth daily.  Dispense: 90 tablet; Refill: 1 -glipizide 25m  1 po BID #60 3 refills  5. BMI 29.0-29.9,adult Discussed diet and exercise for person with BMI >25 Will recheck weight in 3-6 months   Patient will schedule mammogram and eye exam Labs pending Health maintenance reviewed Diet and exercise encouraged Continue all meds Follow up  In 3 months   [place] FNP

## 2017-01-05 NOTE — Patient Instructions (Signed)
Carbohydrate Counting for Diabetes Mellitus, Adult Carbohydrate counting is a method for keeping track of how many carbohydrates you eat. Eating carbohydrates naturally increases the amount of sugar (glucose) in the blood. Counting how many carbohydrates you eat helps keep your blood glucose within normal limits, which helps you manage your diabetes (diabetes mellitus). It is important to know how many carbohydrates you can safely have in each meal. This is different for every person. A diet and nutrition specialist (registered dietitian) can help you make a meal plan and calculate how many carbohydrates you should have at each meal and snack. Carbohydrates are found in the following foods:  Grains, such as breads and cereals.  Dried beans and soy products.  Starchy vegetables, such as potatoes, peas, and corn.  Fruit and fruit juices.  Milk and yogurt.  Sweets and snack foods, such as cake, cookies, candy, chips, and soft drinks. How do I count carbohydrates? There are two ways to count carbohydrates in food. You can use either of the methods or a combination of both. Reading "Nutrition Facts" on packaged food  The "Nutrition Facts" list is included on the labels of almost all packaged foods and beverages in the [place] It includes:  The serving size.  Information about nutrients in each serving, including the grams (g) of carbohydrate per serving. To use the "Nutrition Facts":  Decide how many servings you will have.  Multiply the number of servings by the number of carbohydrates per serving.  The resulting number is the total amount of carbohydrates that you will be having. Learning standard serving sizes of other foods  When you eat foods containing carbohydrates that are not packaged or do not include "Nutrition Facts" on the label, you need to measure the servings in order to count the amount of carbohydrates:  Measure the foods that you will eat with a food scale or measuring  cup, if needed.  Decide how many standard-size servings you will eat.  Multiply the number of servings by 15. Most carbohydrate-rich foods have about 15 g of carbohydrates per serving.  For example, if you eat 8 oz (170 g) of strawberries, you will have eaten 2 servings and 30 g of carbohydrates (2 servings x 15 g = 30 g).  For foods that have more than one food mixed, such as soups and casseroles, you must count the carbohydrates in each food that is included. The following list contains standard serving sizes of common carbohydrate-rich foods. Each of these servings has about 15 g of carbohydrates:   hamburger bun or  English muffin.   oz (15 mL) syrup.   oz (14 g) jelly.  1 slice of bread.  1 six-inch tortilla.  3 oz (85 g) cooked rice or pasta.  4 oz (113 g) cooked dried beans.  4 oz (113 g) starchy vegetable, such as peas, corn, or potatoes.  4 oz (113 g) hot cereal.  4 oz (113 g) mashed potatoes or  of a large baked potato.  4 oz (113 g) canned or frozen fruit.  4 oz (120 mL) fruit juice.  4-6 crackers.  6 chicken nuggets.  6 oz (170 g) unsweetened dry cereal.  6 oz (170 g) plain fat-free yogurt or yogurt sweetened with artificial sweeteners.  8 oz (240 mL) milk.  8 oz (170 g) fresh fruit or one small piece of fruit.  24 oz (680 g) popped popcorn. Example of carbohydrate counting Sample meal  3 oz (85 g) chicken breast.  6 oz (  170 g) brown rice.  4 oz (113 g) corn.  8 oz (240 mL) milk.  8 oz (170 g) strawberries with sugar-free whipped topping. Carbohydrate calculation 1. Identify the foods that contain carbohydrates:  Rice.  Corn.  Milk.  Strawberries. 2. Calculate how many servings you have of each food:  2 servings rice.  1 serving corn.  1 serving milk.  1 serving strawberries. 3. Multiply each number of servings by 15 g:  2 servings rice x 15 g = 30 g.  1 serving corn x 15 g = 15 g.  1 serving milk x 15 g = 15  g.  1 serving strawberries x 15 g = 15 g. 4. Add together all of the amounts to find the total grams of carbohydrates eaten:  30 g + 15 g + 15 g + 15 g = 75 g of carbohydrates total. This information is not intended to replace advice given to you by your health care provider. Make sure you discuss any questions you have with your health care provider. Document Released: 09/18/2005 Document Revised: 04/07/2016 Document Reviewed: 03/01/2016 Elsevier Interactive Patient Education  2017 [employer] Inc.  

## 2017-01-06 LAB — CMP14+EGFR
A/G RATIO: 1.7 (ref 1.2–2.2)
ALT: 18 IU/L (ref 0–32)
AST: 17 IU/L (ref 0–40)
Albumin: 4.2 g/dL (ref 3.6–4.8)
Alkaline Phosphatase: 90 IU/L (ref 39–117)
BUN/Creatinine Ratio: 16 (ref 12–28)
BUN: 14 mg/dL (ref 8–27)
Bilirubin Total: 0.2 mg/dL (ref 0.0–1.2)
CALCIUM: 9.6 mg/dL (ref 8.7–10.3)
CO2: 25 mmol/L (ref 18–29)
Chloride: 99 mmol/L (ref 96–106)
Creatinine, Ser: 0.85 mg/dL (ref 0.57–1.00)
GFR, EST AFRICAN AMERICAN: 84 mL/min/{1.73_m2} (ref >59)
GFR, EST NON AFRICAN AMERICAN: 73 mL/min/{1.73_m2} (ref >59)
GLOBULIN, TOTAL: 2.5 g/dL (ref 1.5–4.5)
Glucose: 144 mg/dL — ABNORMAL HIGH (ref 65–99)
POTASSIUM: 4.1 mmol/L (ref 3.5–5.2)
SODIUM: 140 mmol/L (ref 134–144)
TOTAL PROTEIN: 6.7 g/dL (ref 6.0–8.5)

## 2017-01-06 LAB — CBC WITH DIFFERENTIAL/PLATELET
BASOS ABS: 0 10*3/uL (ref 0.0–0.2)
Basos: 0 %
EOS (ABSOLUTE): 0.2 10*3/uL (ref 0.0–0.4)
Eos: 2 %
Hematocrit: 40.5 % (ref 34.0–46.6)
Hemoglobin: 13.6 g/dL (ref 11.1–15.9)
IMMATURE GRANULOCYTES: 0 %
Immature Grans (Abs): 0 10*3/uL (ref 0.0–0.1)
LYMPHS ABS: 2.6 10*3/uL (ref 0.7–3.1)
Lymphs: 28 %
MCH: 26.2 pg — ABNORMAL LOW (ref 26.6–33.0)
MCHC: 33.6 g/dL (ref 31.5–35.7)
MCV: 78 fL — ABNORMAL LOW (ref 79–97)
MONOS ABS: 0.5 10*3/uL (ref 0.1–0.9)
Monocytes: 5 %
NEUTROS PCT: 65 %
Neutrophils Absolute: 5.9 10*3/uL (ref 1.4–7.0)
PLATELETS: 247 10*3/uL (ref 150–379)
RBC: 5.2 x10E6/uL (ref 3.77–5.28)
RDW: 14.6 % (ref 12.3–15.4)
WBC: 9.3 10*3/uL (ref 3.4–10.8)

## 2017-01-06 LAB — LIPID PANEL
Chol/HDL Ratio: 3.5 ratio (ref 0.0–4.4)
Cholesterol, Total: 213 mg/dL — ABNORMAL HIGH (ref 100–199)
HDL: 61 mg/dL (ref >39)
LDL Calculated: 120 mg/dL — ABNORMAL HIGH (ref 0–99)
TRIGLYCERIDES: 160 mg/dL — AB (ref 0–149)
VLDL Cholesterol Cal: 32 mg/dL (ref 5–40)

## 2017-01-06 LAB — THYROID PANEL WITH TSH
Free Thyroxine Index: 1.8 (ref 1.2–4.9)
T3 UPTAKE RATIO: 29 % (ref 24–39)
T4 TOTAL: 6.3 ug/dL (ref 4.5–12.0)
TSH: 2.35 u[IU]/mL (ref 0.450–4.500)

## 2017-01-06 LAB — MICROALBUMIN / CREATININE URINE RATIO
Creatinine, Urine: 53.7 mg/dL
MICROALB/CREAT RATIO: 22.3 mg/g{creat} (ref 0.0–30.0)
MICROALBUM., U, RANDOM: 12 ug/mL

## 2017-01-08 ENCOUNTER — Other Ambulatory Visit: Payer: Self-pay | Admitting: Nurse Practitioner

## 2017-01-08 LAB — IGP, APTIMA HPV, RFX 16/18,45
HPV Aptima: NEGATIVE
PAP SMEAR COMMENT: 0

## 2017-01-08 MED ORDER — ATORVASTATIN CALCIUM 40 MG PO TABS: 40.0000 mg | ORAL_TABLET | Freq: Every day | ORAL | 5 refills | Status: DC

## 2017-04-02 ENCOUNTER — Other Ambulatory Visit: Payer: Self-pay | Admitting: Nurse Practitioner

## 2017-04-02 DIAGNOSIS — I10 Essential (primary) hypertension: Secondary | ICD-10-CM

## 2017-04-06 ENCOUNTER — Encounter: Payer: Self-pay | Admitting: Nurse Practitioner

## 2017-04-06 ENCOUNTER — Ambulatory Visit (INDEPENDENT_AMBULATORY_CARE_PROVIDER_SITE_OTHER): Payer: BLUE CROSS/BLUE SHIELD | Admitting: Nurse Practitioner

## 2017-04-06 VITALS — BP 136/83 | HR 63 | Temp 97.1°F | Ht 64.0 in | Wt 178.0 lb

## 2017-04-06 DIAGNOSIS — Z23 Encounter for immunization: Secondary | ICD-10-CM

## 2017-04-06 DIAGNOSIS — E782 Mixed hyperlipidemia: Secondary | ICD-10-CM | POA: Diagnosis not present

## 2017-04-06 DIAGNOSIS — Z6829 Body mass index (BMI) 29.0-29.9, adult: Secondary | ICD-10-CM

## 2017-04-06 DIAGNOSIS — E119 Type 2 diabetes mellitus without complications: Secondary | ICD-10-CM

## 2017-04-06 DIAGNOSIS — I10 Essential (primary) hypertension: Secondary | ICD-10-CM | POA: Diagnosis not present

## 2017-04-06 DIAGNOSIS — E785 Hyperlipidemia, unspecified: Secondary | ICD-10-CM | POA: Insufficient documentation

## 2017-04-06 MED ORDER — ATORVASTATIN CALCIUM 40 MG PO TABS: 40.0000 mg | ORAL_TABLET | Freq: Every day | ORAL | 1 refills | Status: DC

## 2017-04-06 MED ORDER — SAXAGLIPTIN-METFORMIN ER 5-1000 MG PO TB24: 1.0000 | ORAL_TABLET | Freq: Every day | ORAL | 1 refills | Status: DC

## 2017-04-06 MED ORDER — GLIPIZIDE 5 MG PO TABS: 5.0000 mg | ORAL_TABLET | Freq: Two times a day (BID) | ORAL | 1 refills | Status: DC

## 2017-04-06 MED ORDER — LOSARTAN POTASSIUM 50 MG PO TABS: 50.0000 mg | ORAL_TABLET | Freq: Every day | ORAL | 1 refills | Status: DC

## 2017-04-06 NOTE — Patient Instructions (Signed)

## 2017-04-06 NOTE — Addendum Note (Signed)
Addended by: Rolena Infante on: 04/06/2017 12:51 PM   Modules accepted: Orders

## 2017-04-06 NOTE — Progress Notes (Signed)
Subjective:    Patient ID: Kara Cortez, female    DOB: 05-19-1951, 66 y.o.   MRN: [id number]  HPI Kara Cortez is here today for follow up of chronic medical problem.  Outpatient Encounter Prescriptions as of 04/06/2017  Medication Sig  . aspirin 81 MG tablet Take 81 mg by mouth daily.  Marland Kitchen atorvastatin (LIPITOR) 40 MG tablet Take 1 tablet (40 mg total) by mouth daily.  Marland Kitchen glipiZIDE (GLUCOTROL) 5 MG tablet Take 1 tablet (5 mg total) by mouth 2 (two) times daily before a meal.  . losartan (COZAAR) 50 MG tablet Take 1 tablet (50 mg total) by mouth daily.  Marland Kitchen losartan (COZAAR) 50 MG tablet TAKE 1 TABLET (50 MG TOTAL) BY MOUTH DAILY.  Marland Kitchen ONETOUCH DELICA LANCETS FINE MISC Test 1 x a day and prn  Dx E11.9  . Saxagliptin-Metformin (KOMBIGLYZE XR) 01-999 MG TB24 Take 1 tablet by mouth daily.   No facility-administered encounter medications on file as of 04/06/2017.     1. Essential hypertension  Patient blood pressure managed with losartan.  Patient checks blood pressure once a month at home 130s/80s.  2. Type 2 diabetes mellitus without complication, without long-term current use of insulin ([place])  Diabetes managed with glipizide and combination saxagliptin-metformin.  Previous A1C 8.7% on 01/05/17.  Patient does not check blood glucose at home.    3. BMI 29.0-29.9,adult  No recent weight gain or loss.    New complaints: None today.    Review of Systems  Constitutional: Negative for activity change, appetite change and fatigue.  Respiratory: Negative for cough and shortness of breath.   Cardiovascular: Negative for chest pain and palpitations.  Gastrointestinal: Negative for abdominal distention and abdominal pain.  Neurological: Negative for dizziness and headaches.  All other systems reviewed and are negative.      Objective:   Physical Exam  Constitutional: She is oriented to person, place, and time. She appears well-developed and well-nourished. No distress.  HENT:    Head: Normocephalic.  Right Ear: External ear normal.  Left Ear: External ear normal.  Nose: Nose normal.  Mouth/Throat: Oropharynx is clear and moist.  Eyes: Pupils are equal, round, and reactive to light.  Neck: Normal range of motion. Neck supple. No JVD present. No thyromegaly present.  Cardiovascular: Normal rate, regular rhythm, normal heart sounds and intact distal pulses.   No murmur heard. Pulmonary/Chest: Effort normal and breath sounds normal. No respiratory distress. She has no wheezes.  Abdominal: Soft. Bowel sounds are normal. She exhibits no distension. There is no tenderness.  Musculoskeletal: Normal range of motion. She exhibits no edema.  Lymphadenopathy:    She has no cervical adenopathy.  Neurological: She is alert and oriented to person, place, and time.  Skin: Skin is warm and dry.  Psychiatric: She has a normal mood and affect. Her behavior is normal. Judgment and thought content normal.   BP 136/83   Pulse 63   Temp (!) 97.1 F (36.2 C) (Oral)   Ht '5\' 4"'  (1.626 m)   Wt 178 lb (80.7 kg)   BMI 30.55 kg/m  A1C: 8.2%    Assessment & Plan:  1. Essential hypertension Low sodium diet - CMP14+EGFR - losartan (COZAAR) 50 MG tablet; Take 1 tablet (50 mg total) by mouth daily.  Dispense: 90 tablet; Refill: 1  2. Type 2 diabetes mellitus without complication, without long-term current use of insulin (HCC) stricter carb counting - Bayer DCA Hb A1c Waived - Lipid panel -  glipiZIDE (GLUCOTROL) 5 MG tablet; Take 1 tablet (5 mg total) by mouth 2 (two) times daily before a meal.  Dispense: 180 tablet; Refill: 1 - Saxagliptin-Metformin (KOMBIGLYZE XR) 01-999 MG TB24; Take 1 tablet by mouth daily.  Dispense: 90 tablet; Refill: 1  3. BMI 29.0-29.9,adult Discussed diet and exercise for person with BMI >25 Will recheck weight in 3-6 months  4. Mixed hyperlipidemia Low fat diet - atorvastatin (LIPITOR) 40 MG tablet; Take 1 tablet (40 mg total) by mouth daily.   Dispense: 90 tablet; Refill: 1   Patient will schedule mammo and eye exam Labs pending Health maintenance reviewed Diet and exercise encouraged Continue all meds Follow up  In 3 month   [place], FNP

## 2017-04-07 LAB — CMP14+EGFR
A/G RATIO: 1.6 (ref 1.2–2.2)
ALT: 15 IU/L (ref 0–32)
AST: 16 IU/L (ref 0–40)
Albumin: 4.1 g/dL (ref 3.6–4.8)
Alkaline Phosphatase: 93 IU/L (ref 39–117)
BILIRUBIN TOTAL: 0.4 mg/dL (ref 0.0–1.2)
BUN/Creatinine Ratio: 16 (ref 12–28)
BUN: 13 mg/dL (ref 8–27)
CHLORIDE: 98 mmol/L (ref 96–106)
CO2: 23 mmol/L (ref 20–29)
Calcium: 9.2 mg/dL (ref 8.7–10.3)
Creatinine, Ser: 0.83 mg/dL (ref 0.57–1.00)
GFR calc non Af Amer: 74 mL/min/{1.73_m2} (ref >59)
GFR, EST AFRICAN AMERICAN: 86 mL/min/{1.73_m2} (ref >59)
GLUCOSE: 77 mg/dL (ref 65–99)
Globulin, Total: 2.5 g/dL (ref 1.5–4.5)
POTASSIUM: 3.8 mmol/L (ref 3.5–5.2)
Sodium: 139 mmol/L (ref 134–144)
TOTAL PROTEIN: 6.6 g/dL (ref 6.0–8.5)

## 2017-04-07 LAB — LIPID PANEL
CHOLESTEROL TOTAL: 125 mg/dL (ref 100–199)
Chol/HDL Ratio: 2 ratio (ref 0.0–4.4)
HDL: 62 mg/dL (ref >39)
LDL Calculated: 40 mg/dL (ref 0–99)
TRIGLYCERIDES: 114 mg/dL (ref 0–149)
VLDL CHOLESTEROL CAL: 23 mg/dL (ref 5–40)

## 2017-04-13 LAB — BAYER DCA HB A1C WAIVED: HB A1C: 8.2 % — AB (ref <7.0)

## 2017-07-13 ENCOUNTER — Ambulatory Visit: Payer: BLUE CROSS/BLUE SHIELD | Admitting: Nurse Practitioner

## 2017-08-13 ENCOUNTER — Other Ambulatory Visit: Payer: BLUE CROSS/BLUE SHIELD

## 2017-08-13 DIAGNOSIS — I10 Essential (primary) hypertension: Secondary | ICD-10-CM

## 2017-08-13 DIAGNOSIS — E119 Type 2 diabetes mellitus without complications: Secondary | ICD-10-CM

## 2017-08-13 LAB — BAYER DCA HB A1C WAIVED: HB A1C: 7.5 % — AB (ref <7.0)

## 2017-08-14 ENCOUNTER — Ambulatory Visit: Payer: BLUE CROSS/BLUE SHIELD | Admitting: Nurse Practitioner

## 2017-08-14 LAB — CMP14+EGFR
ALT: 13 IU/L (ref 0–32)
AST: 14 IU/L (ref 0–40)
Albumin/Globulin Ratio: 1.7 (ref 1.2–2.2)
Albumin: 4.2 g/dL (ref 3.6–4.8)
Alkaline Phosphatase: 100 IU/L (ref 39–117)
BUN / CREAT RATIO: 22 (ref 12–28)
BUN: 16 mg/dL (ref 8–27)
Bilirubin Total: 0.3 mg/dL (ref 0.0–1.2)
CALCIUM: 8.9 mg/dL (ref 8.7–10.3)
CO2: 25 mmol/L (ref 20–29)
CREATININE: 0.74 mg/dL (ref 0.57–1.00)
Chloride: 100 mmol/L (ref 96–106)
GFR calc Af Amer: 98 mL/min/{1.73_m2} (ref >59)
GFR, EST NON AFRICAN AMERICAN: 85 mL/min/{1.73_m2} (ref >59)
GLOBULIN, TOTAL: 2.5 g/dL (ref 1.5–4.5)
Glucose: 81 mg/dL (ref 65–99)
Potassium: 3.8 mmol/L (ref 3.5–5.2)
Sodium: 139 mmol/L (ref 134–144)
Total Protein: 6.7 g/dL (ref 6.0–8.5)

## 2017-08-14 LAB — LIPID PANEL
CHOL/HDL RATIO: 2.2 ratio (ref 0.0–4.4)
CHOLESTEROL TOTAL: 133 mg/dL (ref 100–199)
HDL: 61 mg/dL (ref >39)
LDL CALC: 52 mg/dL (ref 0–99)
Triglycerides: 99 mg/dL (ref 0–149)
VLDL CHOLESTEROL CAL: 20 mg/dL (ref 5–40)

## 2017-08-20 ENCOUNTER — Encounter: Payer: Self-pay | Admitting: Nurse Practitioner

## 2017-08-20 ENCOUNTER — Ambulatory Visit: Payer: BLUE CROSS/BLUE SHIELD | Admitting: Nurse Practitioner

## 2017-08-20 VITALS — BP 134/81 | HR 76 | Temp 97.8°F | Ht 64.0 in | Wt 173.2 lb

## 2017-08-20 DIAGNOSIS — I10 Essential (primary) hypertension: Secondary | ICD-10-CM | POA: Diagnosis not present

## 2017-08-20 DIAGNOSIS — E782 Mixed hyperlipidemia: Secondary | ICD-10-CM | POA: Diagnosis not present

## 2017-08-20 DIAGNOSIS — Z6829 Body mass index (BMI) 29.0-29.9, adult: Secondary | ICD-10-CM

## 2017-08-20 DIAGNOSIS — E119 Type 2 diabetes mellitus without complications: Secondary | ICD-10-CM

## 2017-08-20 NOTE — Patient Instructions (Signed)

## 2017-08-20 NOTE — Progress Notes (Signed)
Subjective:    Patient ID: Kara Cortez, female    DOB: [birth year], 66 y.o.   MRN: [id number]  HPI  Kara Cortez is here today for follow up of chronic medical problem.  Outpatient Encounter Medications as of 08/20/2017  Medication Sig  . aspirin 81 MG tablet Take 81 mg by mouth daily.  Marland Kitchen atorvastatin (LIPITOR) 40 MG tablet Take 1 tablet (40 mg total) by mouth daily.  . fluorouracil (EFUDEX) 5 % cream APPLY EVERY OTHER NIGHT FOR 6 WEEKS  . glipiZIDE (GLUCOTROL) 5 MG tablet Take 1 tablet (5 mg total) by mouth 2 (two) times daily before a meal.  . losartan (COZAAR) 50 MG tablet Take 1 tablet (50 mg total) by mouth daily.  Glory Rosebush DELICA LANCETS FINE MISC Test 1 x a day and prn  Dx E11.9  . Saxagliptin-Metformin (KOMBIGLYZE XR) 01-999 MG TB24 Take 1 tablet by mouth daily.   No facility-administered encounter medications on file as of 08/20/2017.     1. Essential hypertension  No c/o chest pain, sob or headache. Does not check blood pressure at home. BP Readings from Last 3 Encounters:  08/20/17 134/81  04/06/17 136/83  01/05/17 127/83     2. Type 2 diabetes mellitus without complication, without long-term current use of insulin ([place]) last hgba1c was 7.5%. Does not check blood at home. She denies any hypoglycemic symptoms.  3. BMI 29.0-29.9,adult  No recent weight changes  4. 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   Review of Systems  Constitutional: Negative for activity change and appetite change.  HENT: Negative.   Eyes: Negative for pain.  Respiratory: Negative for shortness of breath.   Cardiovascular: Negative for chest pain, palpitations and leg swelling.  Gastrointestinal: Negative for abdominal pain.  Endocrine: Negative for polydipsia.  Genitourinary: Negative.   Skin: Negative for rash.  Neurological: Negative for dizziness, weakness and  headaches.  Hematological: Does not bruise/bleed easily.  Psychiatric/Behavioral: Negative.   All other systems reviewed and are negative.      Objective:   Physical Exam  Constitutional: She is oriented to person, place, and time. She appears well-developed and well-nourished.  HENT:  Nose: Nose normal.  Mouth/Throat: Oropharynx is clear and moist.  Eyes: EOM are normal.  Neck: Trachea normal, normal range of motion and full passive range of motion without pain. Neck supple. No JVD present. Carotid bruit is not present. No thyromegaly present.  Cardiovascular: Normal rate, regular rhythm, normal heart sounds and intact distal pulses. Exam reveals no gallop and no friction rub.  No murmur heard. Pulmonary/Chest: Effort normal and breath sounds normal.  Abdominal: Soft. Bowel sounds are normal. She exhibits no distension and no mass. There is no tenderness.  Musculoskeletal: Normal range of motion.  Lymphadenopathy:    She has no cervical adenopathy.  Neurological: She is alert and oriented to person, place, and time. She has normal reflexes.  Skin: Skin is warm and dry.  Psychiatric: She has a normal mood and affect. Her behavior is normal. Judgment and thought content normal.   BP 134/81   Pulse 76   Temp 97.8 F (36.6 C) (Oral)   Ht 5\' 4"  (1.626 m)   Wt 173 lb 3.2 oz (78.6 kg)   BMI 29.73 kg/m      Assessment & Plan:  1. Essential hypertension Low sodium diet  2. Type 2  diabetes mellitus without complication, without long-term current use of insulin (HCC) Stricter carb counting  3. BMI 29.0-29.9,adult Discussed diet and exercise for person with BMI >25 Will recheck weight in 3-6 months  4. Mixed hyperlipidemia Low fat diet    Labs pending Health maintenance reviewed Diet and exercise encouraged Continue all meds Follow up  In 3 months   [place], FNP

## 2017-09-19 ENCOUNTER — Other Ambulatory Visit: Payer: Self-pay | Admitting: Nurse Practitioner

## 2017-09-19 DIAGNOSIS — E119 Type 2 diabetes mellitus without complications: Secondary | ICD-10-CM

## 2017-11-20 ENCOUNTER — Encounter: Payer: Self-pay | Admitting: Nurse Practitioner

## 2017-11-20 ENCOUNTER — Ambulatory Visit: Payer: BLUE CROSS/BLUE SHIELD | Admitting: Nurse Practitioner

## 2017-11-20 VITALS — BP 130/84 | HR 75 | Temp 97.7°F | Ht 64.0 in | Wt 174.0 lb

## 2017-11-20 DIAGNOSIS — E119 Type 2 diabetes mellitus without complications: Secondary | ICD-10-CM | POA: Diagnosis not present

## 2017-11-20 DIAGNOSIS — I10 Essential (primary) hypertension: Secondary | ICD-10-CM

## 2017-11-20 DIAGNOSIS — E782 Mixed hyperlipidemia: Secondary | ICD-10-CM | POA: Diagnosis not present

## 2017-11-20 DIAGNOSIS — Z6829 Body mass index (BMI) 29.0-29.9, adult: Secondary | ICD-10-CM | POA: Diagnosis not present

## 2017-11-20 LAB — BAYER DCA HB A1C WAIVED: HB A1C (BAYER DCA - WAIVED): 7.8 % — ABNORMAL HIGH (ref <7.0)

## 2017-11-20 MED ORDER — LOSARTAN POTASSIUM 50 MG PO TABS: 50.0000 mg | ORAL_TABLET | Freq: Every day | ORAL | 1 refills | Status: DC

## 2017-11-20 MED ORDER — GLIPIZIDE 5 MG PO TABS: 5.0000 mg | ORAL_TABLET | Freq: Two times a day (BID) | ORAL | 1 refills | Status: DC

## 2017-11-20 MED ORDER — ATORVASTATIN CALCIUM 40 MG PO TABS: 40.0000 mg | ORAL_TABLET | Freq: Every day | ORAL | 1 refills | Status: DC

## 2017-11-20 MED ORDER — SAXAGLIPTIN-METFORMIN ER 5-1000 MG PO TB24: 1.0000 | ORAL_TABLET | Freq: Every day | ORAL | 1 refills | Status: DC

## 2017-11-20 NOTE — Progress Notes (Signed)
Subjective:    Patient ID: Kara Cortez, female    DOB: May 07, 1951, 67 y.o.   MRN: [id number]  HPI  Kara Cortez is here today for follow up of chronic medical problem.  Outpatient Encounter Medications as of 11/20/2017  Medication Sig  . aspirin 81 MG tablet Take 81 mg by mouth daily.  Marland Kitchen atorvastatin (LIPITOR) 40 MG tablet Take 1 tablet (40 mg total) by mouth daily.  . fluorouracil (EFUDEX) 5 % cream APPLY EVERY OTHER NIGHT FOR 6 WEEKS  . glipiZIDE (GLUCOTROL) 5 MG tablet TAKE 1 TABLET (5 MG TOTAL) BY MOUTH 2 (TWO) TIMES DAILY BEFORE A MEAL.  Marland Kitchen losartan (COZAAR) 50 MG tablet Take 1 tablet (50 mg total) by mouth daily.  Kara Cortez DELICA LANCETS FINE MISC Test 1 x a day and prn  Dx E11.9  . Saxagliptin-Metformin (KOMBIGLYZE XR) 01-999 MG TB24 Take 1 tablet by mouth daily.     1. Essential hypertension  No c/o chest pain sob or headache. Does not check blood pressure at home. BP Readings from Last 3 Encounters:  08/20/17 134/81  04/06/17 136/83  01/05/17 127/83     2. Type 2 diabetes mellitus without complication, without long-term current use of insulin ([place]) last hgba1c 7.5%. Fasting blood sugars running around 130-140 when checking, but she does not check them very often.  3. BMI 29.0-29.9,adult  No recent weight changes  4. Mixed hyperlipidemia  Admits to not watching her diet at all.    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  HENT: Negative.   Eyes: Negative for pain.  Respiratory: Negative for shortness of breath.   Cardiovascular: Negative for chest pain, palpitations and leg swelling.  Gastrointestinal: Negative for abdominal pain.  Endocrine: Negative for polydipsia.  Genitourinary: Negative.   Skin: Negative for rash.  Neurological: Negative for dizziness, weakness and headaches.  Hematological:  Does not bruise/bleed easily.  Psychiatric/Behavioral: Negative.   All other systems reviewed and are negative.      Objective:   Physical Exam  Constitutional: She is oriented to person, place, and time. She appears well-developed and well-nourished.  HENT:  Nose: Nose normal.  Mouth/Throat: Oropharynx is clear and moist.  Eyes: EOM are normal.  Neck: Trachea normal, normal range of motion and full passive range of motion without pain. Neck supple. No JVD present. Carotid bruit is not present. No thyromegaly present.  Cardiovascular: Normal rate, regular rhythm, normal heart sounds and intact distal pulses. Exam reveals no gallop and no friction rub.  No murmur heard. Pulmonary/Chest: Effort normal and breath sounds normal.  Abdominal: Soft. Bowel sounds are normal. She exhibits no distension and no mass. There is no tenderness.  Musculoskeletal: Normal range of motion.  Lymphadenopathy:    She has no cervical adenopathy.  Neurological: She is alert and oriented to person, place, and time. She has normal reflexes.  Skin: Skin is warm and dry.  Psychiatric: She has a normal mood and affect. Her behavior is normal. Judgment and thought content normal.    BP 130/84   Pulse 75   Temp 97.7 F (36.5 C) (Oral)   Ht _0  (1.626 m)   Wt 174 lb (78.9 kg)   BMI 29.87 kg/m   Hgba1c discussed at appointment 7.8%     Assessment & Plan:  1. Essential hypertension Low sodium diet - CMP14+EGFR -  losartan (COZAAR) 50 MG tablet; Take 1 tablet (50 mg total) by mouth daily.  Dispense: 90 tablet; Refill: 1  2. Type 2 diabetes mellitus without complication, without long-term current use of insulin (HCC) Stricter carb counting Keep diary of fasting blood sugars - Bayer DCA Hb A1c Waived - glipiZIDE (GLUCOTROL) 5 MG tablet; Take 1 tablet (5 mg total) by mouth 2 (two) times daily before a meal.  Dispense: 180 tablet; Refill: 1 - Saxagliptin-Metformin (KOMBIGLYZE XR) 01-999 MG TB24; Take 1  tablet by mouth daily.  Dispense: 90 tablet; Refill: 1  3. BMI 29.0-29.9,adult Discussed diet and exercise for person with BMI >25 Will recheck weight in 3-6 months  4. Mixed hyperlipidemia Low fat diet - Lipid panel - atorvastatin (LIPITOR) 40 MG tablet; Take 1 tablet (40 mg total) by mouth daily.  Dispense: 90 tablet; Refill: 1    Labs pending Health maintenance reviewed Diet and exercise encouraged Continue all meds Follow up  In 3 months   [place], FNP

## 2017-11-20 NOTE — Patient Instructions (Signed)
Diabetes Mellitus and Nutrition When you have diabetes (diabetes mellitus), it is very important to have healthy eating habits because your blood sugar (glucose) levels are greatly affected by what you eat and drink. Eating healthy foods in the appropriate amounts, at about the same times every day, can help you:  Control your blood glucose.  Lower your risk of heart disease.  Improve your blood pressure.  Reach or maintain a healthy weight.  Every person with diabetes is different, and each person has different needs for a meal plan. Your health care provider may recommend that you work with a diet and nutrition specialist (dietitian) to make a meal plan that is best for you. Your meal plan may vary depending on factors such as:  The calories you need.  The medicines you take.  Your weight.  Your blood glucose, blood pressure, and cholesterol levels.  Your activity level.  Other health conditions you have, such as heart or kidney disease.  How do carbohydrates affect me? Carbohydrates affect your blood glucose level more than any other type of food. Eating carbohydrates naturally increases the amount of glucose in your blood. Carbohydrate counting is a method for keeping track of how many carbohydrates you eat. Counting carbohydrates is important to keep your blood glucose at a healthy level, especially if you use insulin or take certain oral diabetes medicines. It is important to know how many carbohydrates you can safely have in each meal. This is different for every person. Your dietitian can help you calculate how many carbohydrates you should have at each meal and for snack. Foods that contain carbohydrates include:  Bread, cereal, rice, pasta, and crackers.  Potatoes and corn.  Peas, beans, and lentils.  Milk and yogurt.  Fruit and juice.  Desserts, such as cakes, cookies, ice cream, and candy.  How does alcohol affect me? Alcohol can cause a sudden decrease in blood  glucose (hypoglycemia), especially if you use insulin or take certain oral diabetes medicines. Hypoglycemia can be a life-threatening condition. Symptoms of hypoglycemia (sleepiness, dizziness, and confusion) are similar to symptoms of having too much alcohol. If your health care provider says that alcohol is safe for you, follow these guidelines:  Limit alcohol intake to no more than 1 drink per day for nonpregnant women and 2 drinks per day for men. One drink equals 12 oz of beer, 5 oz of wine, or 1 oz of hard liquor.  Do not drink on an empty stomach.  Keep yourself hydrated with water, diet soda, or unsweetened iced tea.  Keep in mind that regular soda, juice, and other mixers may contain a lot of sugar and must be counted as carbohydrates.  What are tips for following this plan? Reading food labels  Start by checking the serving size on the label. The amount of calories, carbohydrates, fats, and other nutrients listed on the label are based on one serving of the food. Many foods contain more than one serving per package.  Check the total grams (g) of carbohydrates in one serving. You can calculate the number of servings of carbohydrates in one serving by dividing the total carbohydrates by 15. For example, if a food has 30 g of total carbohydrates, it would be equal to 2 servings of carbohydrates.  Check the number of grams (g) of saturated and trans fats in one serving. Choose foods that have low or no amount of these fats.  Check the number of milligrams (mg) of sodium in one serving. Most people   should limit total sodium intake to less than 2,300 mg per day.  Always check the nutrition information of foods labeled as "low-fat" or "nonfat". These foods may be higher in added sugar or refined carbohydrates and should be avoided.  Talk to your dietitian to identify your daily goals for nutrients listed on the label. Shopping  Avoid buying canned, premade, or processed foods. These  foods tend to be high in fat, sodium, and added sugar.  Shop around the outside edge of the grocery store. This includes fresh fruits and vegetables, bulk grains, fresh meats, and fresh dairy. Cooking  Use low-heat cooking methods, such as baking, instead of high-heat cooking methods like deep frying.  Cook using healthy oils, such as olive, canola, or sunflower oil.  Avoid cooking with butter, cream, or high-fat meats. Meal planning  Eat meals and snacks regularly, preferably at the same times every day. Avoid going long periods of time without eating.  Eat foods high in fiber, such as fresh fruits, vegetables, beans, and whole grains. Talk to your dietitian about how many servings of carbohydrates you can eat at each meal.  Eat 4-6 ounces of lean protein each day, such as lean meat, chicken, fish, eggs, or tofu. 1 ounce is equal to 1 ounce of meat, chicken, or fish, 1 egg, or 1/4 cup of tofu.  Eat some foods each day that contain healthy fats, such as avocado, nuts, seeds, and fish. Lifestyle   Check your blood glucose regularly.  Exercise at least 30 minutes 5 or more days each week, or as told by your health care provider.  Take medicines as told by your health care provider.  Do not use any products that contain nicotine or tobacco, such as cigarettes and e-cigarettes. If you need help quitting, ask your health care provider.  Work with a counselor or diabetes educator to identify strategies to manage stress and any emotional and social challenges. What are some questions to ask my health care provider?  Do I need to meet with a diabetes educator?  Do I need to meet with a dietitian?  What number can I call if I have questions?  When are the best times to check my blood glucose? Where to find more information:  American Diabetes Association: diabetes.org/food-and-fitness/food  Academy of Nutrition and Dietetics:  www.eatright.org/resources/health/diseases-and-conditions/diabetes  National Institute of Diabetes and Digestive and Kidney Diseases (NIH): www.niddk.nih.gov/health-information/diabetes/overview/diet-eating-physical-activity Summary  A healthy meal plan will help you control your blood glucose and maintain a healthy lifestyle.  Working with a diet and nutrition specialist (dietitian) can help you make a meal plan that is best for you.  Keep in mind that carbohydrates and alcohol have immediate effects on your blood glucose levels. It is important to count carbohydrates and to use alcohol carefully. This information is not intended to replace advice given to you by your health care provider. Make sure you discuss any questions you have with your health care provider. Document Released: 06/15/2005 Document Revised: 10/23/2016 Document Reviewed: 10/23/2016 Elsevier Interactive Patient Education  2018 Elsevier Inc.  

## 2017-11-21 LAB — CMP14+EGFR
ALT: 12 IU/L (ref 0–32)
AST: 16 IU/L (ref 0–40)
Albumin/Globulin Ratio: 1.9 (ref 1.2–2.2)
Albumin: 4.3 g/dL (ref 3.6–4.8)
Alkaline Phosphatase: 93 IU/L (ref 39–117)
BUN/Creatinine Ratio: 19 (ref 12–28)
BUN: 15 mg/dL (ref 8–27)
Bilirubin Total: 0.3 mg/dL (ref 0.0–1.2)
CALCIUM: 9.7 mg/dL (ref 8.7–10.3)
CO2: 22 mmol/L (ref 20–29)
CREATININE: 0.81 mg/dL (ref 0.57–1.00)
Chloride: 99 mmol/L (ref 96–106)
GFR calc Af Amer: 88 mL/min/{1.73_m2} (ref >59)
GFR, EST NON AFRICAN AMERICAN: 76 mL/min/{1.73_m2} (ref >59)
GLOBULIN, TOTAL: 2.3 g/dL (ref 1.5–4.5)
Glucose: 112 mg/dL — ABNORMAL HIGH (ref 65–99)
Potassium: 4.3 mmol/L (ref 3.5–5.2)
Sodium: 140 mmol/L (ref 134–144)
Total Protein: 6.6 g/dL (ref 6.0–8.5)

## 2017-11-21 LAB — LIPID PANEL
CHOL/HDL RATIO: 2.4 ratio (ref 0.0–4.4)
Cholesterol, Total: 162 mg/dL (ref 100–199)
HDL: 68 mg/dL (ref >39)
LDL CALC: 76 mg/dL (ref 0–99)
TRIGLYCERIDES: 91 mg/dL (ref 0–149)
VLDL Cholesterol Cal: 18 mg/dL (ref 5–40)

## 2018-02-21 ENCOUNTER — Encounter: Payer: Self-pay | Admitting: Nurse Practitioner

## 2018-02-21 ENCOUNTER — Ambulatory Visit: Payer: BLUE CROSS/BLUE SHIELD | Admitting: Nurse Practitioner

## 2018-02-21 ENCOUNTER — Ambulatory Visit (INDEPENDENT_AMBULATORY_CARE_PROVIDER_SITE_OTHER): Payer: BLUE CROSS/BLUE SHIELD

## 2018-02-21 VITALS — BP 142/90 | HR 69 | Temp 97.8°F | Ht 64.0 in | Wt 173.0 lb

## 2018-02-21 DIAGNOSIS — Z6829 Body mass index (BMI) 29.0-29.9, adult: Secondary | ICD-10-CM

## 2018-02-21 DIAGNOSIS — I10 Essential (primary) hypertension: Secondary | ICD-10-CM

## 2018-02-21 DIAGNOSIS — E782 Mixed hyperlipidemia: Secondary | ICD-10-CM

## 2018-02-21 DIAGNOSIS — E119 Type 2 diabetes mellitus without complications: Secondary | ICD-10-CM

## 2018-02-21 DIAGNOSIS — Z13820 Encounter for screening for osteoporosis: Secondary | ICD-10-CM

## 2018-02-21 DIAGNOSIS — Z780 Asymptomatic menopausal state: Secondary | ICD-10-CM | POA: Diagnosis not present

## 2018-02-21 LAB — BAYER DCA HB A1C WAIVED: HB A1C: 7.9 % — AB (ref <7.0)

## 2018-02-21 NOTE — Progress Notes (Signed)
Subjective:    Patient ID: Kara Cortez, female    DOB: [birth year], 67 y.o.   MRN: [id number]   Chief Complaint: No chief complaint on file.   HPI:  1. Essential hypertension  No c/o chest pain , sob or headache. Does not check blood pressures at home. BP Readings from Last 3 Encounters:  11/20/17 130/84  08/20/17 134/81  04/06/17 136/83    2. Type 2 diabetes mellitus without complication, without long-term current use of insulin ([place]) last hgba1c was 7.8%. No changes were made to plan of care at that time, other than stricter carb counting and keeping a diary of fasting blood sugars. Patient has not been checking blood sugars. Not really watching diet very closely  3. Mixed hyperlipidemia  Not watching diet and doing very little if any exercise  4. BMI 29.0-29.9,adult  No recent weight changes    Outpatient Encounter Medications as of 02/21/2018  Medication Sig  . aspirin 81 MG tablet Take 81 mg by mouth daily.  Marland Kitchen atorvastatin (LIPITOR) 40 MG tablet Take 1 tablet (40 mg total) by mouth daily.  Marland Kitchen glipiZIDE (GLUCOTROL) 5 MG tablet Take 1 tablet (5 mg total) by mouth 2 (two) times daily before a meal.  . losartan (COZAAR) 50 MG tablet Take 1 tablet (50 mg total) by mouth daily.  Glory Rosebush DELICA LANCETS FINE MISC Test 1 x a day and prn  Dx E11.9  . Saxagliptin-Metformin (KOMBIGLYZE XR) 01-999 MG TB24 Take 1 tablet by mouth daily.       New complaints: None today  Social history: Still working for [employer]  Constitutional: Negative for activity change and appetite change.  HENT: Negative.   Eyes: Negative for pain.  Respiratory: Negative for shortness of breath.   Cardiovascular: Negative for chest pain, palpitations and leg swelling.  Gastrointestinal: Negative for abdominal pain.  Endocrine: Negative for polydipsia.  Genitourinary: Negative.   Skin: Negative for rash.  Neurological: Negative for dizziness, weakness and  headaches.  Hematological: Does not bruise/bleed easily.  Psychiatric/Behavioral: Negative.   All other systems reviewed and are negative.      Objective:   Physical Exam  Constitutional: She is oriented to person, place, and time.  HENT:  Head: Normocephalic.  Nose: Nose normal.  Mouth/Throat: Oropharynx is clear and moist.  Eyes: Pupils are equal, round, and reactive to light. EOM are normal.  Neck: Normal range of motion. Neck supple. No JVD present. Carotid bruit is not present.  Cardiovascular: Normal rate, regular rhythm, normal heart sounds and intact distal pulses.  Pulmonary/Chest: Effort normal and breath sounds normal. No respiratory distress. She has no wheezes. She has no rales. She exhibits no tenderness.  Abdominal: Soft. Normal appearance, normal aorta and bowel sounds are normal. She exhibits no distension, no abdominal bruit, no pulsatile midline mass and no mass. There is no splenomegaly or hepatomegaly. There is no tenderness.  Musculoskeletal: Normal range of motion. She exhibits no edema.  Lymphadenopathy:    She has no cervical adenopathy.  Neurological: She is alert and oriented to person, place, and time. She has normal reflexes.  Skin: Skin is warm and dry.  Psychiatric: Judgment normal.    BP (!) 142/90 (BP Location: Right Arm, Cuff Size: Normal)   Pulse 69   Temp 97.8 F (36.6 C) (Oral)   Ht '5\' 4"'  (1.626 m)   Wt 173 lb (78.5 kg)   BMI 29.70 kg/m   hgba1c 7.9%  Assessment & Plan:  MARIEME MCMACKIN comes in today with chief complaint of Medical Management of Chronic Issues   Diagnosis and orders addressed:  1. Essential hypertension Low sodium diet Please check blood pressure at home and let me know if remains above 944 systolic - [id number]  2. Type 2 diabetes mellitus without complication, without long-term current use of insulin ([facility]CC) Stricter carb counting HGBA1c is creeping to close to 8 - may need to add injectable if not  better at next visit - Bayer DCA Hb A1c Waived - Microalbumin / creatinine urine ratio  3. Mixed hyperlipidemia Low fat diet - Lipid panel  4. BMI 29.0-29.9,adult Discussed diet and exercise for person with BMI >25 Will recheck weight in 3-6 months   5. Screening for osteoporosis - DG [facility] DEXA  ptient will schedule mammogram Labs pending Health Maintenance reviewed Diet and exercise encouraged  Follow up plan: 3 months   Mary-Margaret Hassell Done, FNP

## 2018-02-21 NOTE — Patient Instructions (Signed)
DASH Eating Plan DASH stands for "Dietary Approaches to Stop Hypertension." The DASH eating plan is a healthy eating plan that has been shown to reduce high blood pressure (hypertension). It may also reduce your risk for type 2 diabetes, heart disease, and stroke. The DASH eating plan may also help with weight loss. What are tips for following this plan? General guidelines  Avoid eating more than 2,300 mg (milligrams) of salt (sodium) a day. If you have hypertension, you may need to reduce your sodium intake to 1,500 mg a day.  Limit alcohol intake to no more than 1 drink a day for nonpregnant women and 2 drinks a day for men. One drink equals 12 oz of beer, 5 oz of wine, or 1 oz of hard liquor.  Work with your health care provider to maintain a healthy body weight or to lose weight. Ask what an ideal weight is for you.  Get at least 30 minutes of exercise that causes your heart to beat faster (aerobic exercise) most days of the week. Activities may include walking, swimming, or biking.  Work with your health care provider or diet and nutrition specialist (dietitian) to adjust your eating plan to your individual calorie needs. Reading food labels  Check food labels for the amount of sodium per serving. Choose foods with less than 5 percent of the Daily Value of sodium. Generally, foods with less than 300 mg of sodium per serving fit into this eating plan.  To find whole grains, look for the word "whole" as the first word in the ingredient list. Shopping  Buy products labeled as "low-sodium" or "no salt added."  Buy fresh foods. Avoid canned foods and premade or frozen meals. Cooking  Avoid adding salt when cooking. Use salt-free seasonings or herbs instead of table salt or sea salt. Check with your health care provider or pharmacist before using salt substitutes.  Do not fry foods. Cook foods using healthy methods such as baking, boiling, grilling, and broiling instead.  Cook with  heart-healthy oils, such as olive, canola, soybean, or sunflower oil. Meal planning   Eat a balanced diet that includes: ? 5 or more servings of fruits and vegetables each day. At each meal, try to fill half of your plate with fruits and vegetables. ? Up to 6-8 servings of whole grains each day. ? Less than 6 oz of lean meat, poultry, or fish each day. A 3-oz serving of meat is about the same size as a deck of cards. One egg equals 1 oz. ? 2 servings of low-fat dairy each day. ? A serving of nuts, seeds, or beans 5 times each week. ? Heart-healthy fats. Healthy fats called Omega-3 fatty acids are found in foods such as flaxseeds and coldwater fish, like sardines, salmon, and mackerel.  Limit how much you eat of the following: ? Canned or prepackaged foods. ? Food that is high in trans fat, such as fried foods. ? Food that is high in saturated fat, such as fatty meat. ? Sweets, desserts, sugary drinks, and other foods with added sugar. ? Full-fat dairy products.  Do not salt foods before eating.  Try to eat at least 2 vegetarian meals each week.  Eat more home-cooked food and less restaurant, buffet, and fast food.  When eating at a restaurant, ask that your food be prepared with less salt or no salt, if possible. What foods are recommended? The items listed may not be a complete list. Talk with your dietitian about what   dietary choices are best for you. Grains Whole-grain or whole-wheat bread. Whole-grain or whole-wheat pasta. Brown rice. Oatmeal. Quinoa. Bulgur. Whole-grain and low-sodium cereals. Pita bread. Low-fat, low-sodium crackers. Whole-wheat flour tortillas. Vegetables Fresh or frozen vegetables (raw, steamed, roasted, or grilled). Low-sodium or reduced-sodium tomato and vegetable juice. Low-sodium or reduced-sodium tomato sauce and tomato paste. Low-sodium or reduced-sodium canned vegetables. Fruits All fresh, dried, or frozen fruit. Canned fruit in natural juice (without  added sugar). Meat and other protein foods Skinless chicken or turkey. Ground chicken or turkey. Pork with fat trimmed off. Fish and seafood. Egg whites. Dried beans, peas, or lentils. Unsalted nuts, nut butters, and seeds. Unsalted canned beans. Lean cuts of beef with fat trimmed off. Low-sodium, lean deli meat. Dairy Low-fat (1%) or fat-free (skim) milk. Fat-free, low-fat, or reduced-fat cheeses. Nonfat, low-sodium ricotta or cottage cheese. Low-fat or nonfat yogurt. Low-fat, low-sodium cheese. Fats and oils Soft margarine without trans fats. Vegetable oil. Low-fat, reduced-fat, or light mayonnaise and salad dressings (reduced-sodium). Canola, safflower, olive, soybean, and sunflower oils. Avocado. Seasoning and other foods Herbs. Spices. Seasoning mixes without salt. Unsalted popcorn and pretzels. Fat-free sweets. What foods are not recommended? The items listed may not be a complete list. Talk with your dietitian about what dietary choices are best for you. Grains Baked goods made with fat, such as croissants, muffins, or some breads. Dry pasta or rice meal packs. Vegetables Creamed or fried vegetables. Vegetables in a cheese sauce. Regular canned vegetables (not low-sodium or reduced-sodium). Regular canned tomato sauce and paste (not low-sodium or reduced-sodium). Regular tomato and vegetable juice (not low-sodium or reduced-sodium). Pickles. Olives. Fruits Canned fruit in a light or heavy syrup. Fried fruit. Fruit in cream or butter sauce. Meat and other protein foods Fatty cuts of meat. Ribs. Fried meat. Bacon. Sausage. Bologna and other processed lunch meats. Salami. Fatback. Hotdogs. Bratwurst. Salted nuts and seeds. Canned beans with added salt. Canned or smoked fish. Whole eggs or egg yolks. Chicken or turkey with skin. Dairy Whole or 2% milk, cream, and half-and-half. Whole or full-fat cream cheese. Whole-fat or sweetened yogurt. Full-fat cheese. Nondairy creamers. Whipped toppings.  Processed cheese and cheese spreads. Fats and oils Butter. Stick margarine. Lard. Shortening. Ghee. Bacon fat. Tropical oils, such as coconut, palm kernel, or palm oil. Seasoning and other foods Salted popcorn and pretzels. Onion salt, garlic salt, seasoned salt, table salt, and sea salt. Worcestershire sauce. Tartar sauce. Barbecue sauce. Teriyaki sauce. Soy sauce, including reduced-sodium. Steak sauce. Canned and packaged gravies. Fish sauce. Oyster sauce. Cocktail sauce. Horseradish that you find on the shelf. Ketchup. Mustard. Meat flavorings and tenderizers. Bouillon cubes. Hot sauce and Tabasco sauce. Premade or packaged marinades. Premade or packaged taco seasonings. Relishes. Regular salad dressings. Where to find more information:  National Heart, Lung, and Blood Institute: www.nhlbi.nih.gov  American Heart Association: www.heart.org Summary  The DASH eating plan is a healthy eating plan that has been shown to reduce high blood pressure (hypertension). It may also reduce your risk for type 2 diabetes, heart disease, and stroke.  With the DASH eating plan, you should limit salt (sodium) intake to 2,300 mg a day. If you have hypertension, you may need to reduce your sodium intake to 1,500 mg a day.  When on the DASH eating plan, aim to eat more fresh fruits and vegetables, whole grains, lean proteins, low-fat dairy, and heart-healthy fats.  Work with your health care provider or diet and nutrition specialist (dietitian) to adjust your eating plan to your individual   calorie needs. This information is not intended to replace advice given to you by your health care provider. Make sure you discuss any questions you have with your health care provider. Document Released: 09/07/2011 Document Revised: 09/11/2016 Document Reviewed: 09/11/2016 Elsevier Interactive Patient Education  2018 Elsevier Inc.  

## 2018-02-22 LAB — LIPID PANEL
CHOL/HDL RATIO: 2 ratio (ref 0.0–4.4)
Cholesterol, Total: 134 mg/dL (ref 100–199)
HDL: 66 mg/dL (ref >39)
LDL Calculated: 47 mg/dL (ref 0–99)
TRIGLYCERIDES: 103 mg/dL (ref 0–149)
VLDL Cholesterol Cal: 21 mg/dL (ref 5–40)

## 2018-02-22 LAB — CMP14+EGFR
A/G RATIO: 2 (ref 1.2–2.2)
ALK PHOS: 102 IU/L (ref 39–117)
ALT: 15 IU/L (ref 0–32)
AST: 13 IU/L (ref 0–40)
Albumin: 4.2 g/dL (ref 3.6–4.8)
BILIRUBIN TOTAL: 0.4 mg/dL (ref 0.0–1.2)
BUN / CREAT RATIO: 14 (ref 12–28)
BUN: 12 mg/dL (ref 8–27)
CHLORIDE: 104 mmol/L (ref 96–106)
CO2: 24 mmol/L (ref 20–29)
Calcium: 9.4 mg/dL (ref 8.7–10.3)
Creatinine, Ser: 0.83 mg/dL (ref 0.57–1.00)
GFR calc Af Amer: 86 mL/min/{1.73_m2} (ref >59)
GFR calc non Af Amer: 74 mL/min/{1.73_m2} (ref >59)
Globulin, Total: 2.1 g/dL (ref 1.5–4.5)
Glucose: 95 mg/dL (ref 65–99)
POTASSIUM: 4.2 mmol/L (ref 3.5–5.2)
Sodium: 143 mmol/L (ref 134–144)
TOTAL PROTEIN: 6.3 g/dL (ref 6.0–8.5)

## 2018-02-22 LAB — MICROALBUMIN / CREATININE URINE RATIO
Creatinine, Urine: 119.6 mg/dL
MICROALBUM., U, RANDOM: 20.6 ug/mL
Microalb/Creat Ratio: 17.2 mg/g creat (ref 0.0–30.0)

## 2018-04-19 ENCOUNTER — Encounter: Payer: Self-pay | Admitting: Family Medicine

## 2018-04-19 ENCOUNTER — Ambulatory Visit: Payer: BLUE CROSS/BLUE SHIELD | Admitting: Family Medicine

## 2018-04-19 VITALS — BP 141/86 | HR 85 | Temp 97.1°F | Ht 64.0 in | Wt 174.2 lb

## 2018-04-19 DIAGNOSIS — R05 Cough: Secondary | ICD-10-CM

## 2018-04-19 DIAGNOSIS — R059 Cough, unspecified: Secondary | ICD-10-CM

## 2018-04-19 MED ORDER — AZITHROMYCIN 250 MG PO TABS
ORAL_TABLET | ORAL | 0 refills | Status: DC
Start: 2018-04-19 — End: 2018-05-27

## 2018-04-19 NOTE — Patient Instructions (Signed)
Great to see you!  Try mucinex DM 12 hour and the antibiotics

## 2018-04-19 NOTE — Progress Notes (Signed)
   HPI  Patient presents today here for cough.  Patient reports 2 to 3 days of cough and chest congestion.  She also has some nasal drainage.  She denies shortness of breath, fever, chills, sweats.  She denies chest pain. She is tolerating food and fluids like usual  PMH: Smoking status noted ROS: Per HPI  Objective: BP (!) 141/86   Pulse 85   Temp (!) 97.1 F (36.2 C) (Oral)   Ht 5\' 4"  (1.626 m)   Wt 174 lb 3.2 oz (79 kg)   SpO2 98%   BMI 29.90 kg/m  Gen: NAD, alert, cooperative with exam HEENT: NCAT, oropharynx clear and moist, nares clear, TMs normal bilaterally, no tenderness to palpation of the sinuses CV: RRR, good S1/S2, no murmur Resp: Nonlabored, good air movement, faint rales in the left lower lung  Abd: SNTND, BS present, no guarding or organomegaly Ext: No edema, warm Neuro: Alert and oriented, No gross deficits  Assessment and plan:  #Cough Possible developing pneumonia with the faint rales on exam Azithromycin Supportive care otherwise, return to clinic with any concerns     Meds ordered this encounter  Medications  . azithromycin (ZITHROMAX) 250 MG tablet    Sig: Take 2 tablets on day 1 and 1 tablet daily after that    Dispense:  6 tablet    Refill:  0    Laroy Apple, MD [facility] 04/19/2018, 4:58 PM

## 2018-04-29 ENCOUNTER — Ambulatory Visit: Payer: BLUE CROSS/BLUE SHIELD | Admitting: Nurse Practitioner

## 2018-04-29 ENCOUNTER — Telehealth: Payer: Self-pay | Admitting: Nurse Practitioner

## 2018-04-29 DIAGNOSIS — R059 Cough, unspecified: Secondary | ICD-10-CM

## 2018-04-29 DIAGNOSIS — R05 Cough: Secondary | ICD-10-CM

## 2018-04-29 MED ORDER — PREDNISONE 20 MG PO TABS: ORAL_TABLET | ORAL | 0 refills | Status: DC

## 2018-04-29 MED ORDER — HYDROCODONE-HOMATROPINE 5-1.5 MG/5ML PO SYRP: 5.0000 mL | ORAL_SOLUTION | Freq: Four times a day (QID) | ORAL | 0 refills | Status: DC | PRN

## 2018-04-29 NOTE — Telephone Encounter (Signed)
What symptoms do you have? Really bad cough. Seen Wendi Snipes 7-19  How long have you been sick? 2 weeks  Have you been seen for this problem? Yes  If your provider decides to give you a prescription, which pharmacy would you like for it to be sent to? [facility] in [place]   Patient informed that this information will be sent to the clinical staff for review and that they should receive a follow up call.

## 2018-04-29 NOTE — Telephone Encounter (Signed)
Spoke with pt and she states she completed the Zpak and has been taking Mucinex DM otc but the cough is terrible and isn't getting any better. She would like to know if you could send something in for the cough. She did go ahead and schedule an appt this afternoon at 4:15 but if you don't need to see her she would like it cancelled. Please advise?

## 2018-04-29 NOTE — Telephone Encounter (Signed)
Pt aware rx sent into pharmacy and appt canceled for today at 4:15.

## 2018-04-29 NOTE — Addendum Note (Signed)
Addended by: Chevis Pretty on: 04/29/2018 01:43 PM   Modules accepted: Orders

## 2018-04-29 NOTE — Telephone Encounter (Signed)
Sent in rx for codiene cough meds and prednisone - does not need to be seen today.

## 2018-05-10 ENCOUNTER — Other Ambulatory Visit: Payer: Self-pay | Admitting: Nurse Practitioner

## 2018-05-10 DIAGNOSIS — E119 Type 2 diabetes mellitus without complications: Secondary | ICD-10-CM

## 2018-05-27 ENCOUNTER — Encounter: Payer: Self-pay | Admitting: Nurse Practitioner

## 2018-05-27 ENCOUNTER — Ambulatory Visit: Payer: BLUE CROSS/BLUE SHIELD | Admitting: Nurse Practitioner

## 2018-05-27 VITALS — BP 148/88 | HR 75 | Temp 97.7°F | Ht 64.0 in | Wt 175.8 lb

## 2018-05-27 DIAGNOSIS — E782 Mixed hyperlipidemia: Secondary | ICD-10-CM | POA: Diagnosis not present

## 2018-05-27 DIAGNOSIS — Z6829 Body mass index (BMI) 29.0-29.9, adult: Secondary | ICD-10-CM

## 2018-05-27 DIAGNOSIS — E119 Type 2 diabetes mellitus without complications: Secondary | ICD-10-CM

## 2018-05-27 DIAGNOSIS — J301 Allergic rhinitis due to pollen: Secondary | ICD-10-CM

## 2018-05-27 DIAGNOSIS — I10 Essential (primary) hypertension: Secondary | ICD-10-CM

## 2018-05-27 LAB — CMP14+EGFR
ALT: 12 IU/L (ref 0–32)
AST: 13 IU/L (ref 0–40)
Albumin/Globulin Ratio: 1.7 (ref 1.2–2.2)
Albumin: 4.1 g/dL (ref 3.6–4.8)
Alkaline Phosphatase: 99 IU/L (ref 39–117)
BILIRUBIN TOTAL: 0.3 mg/dL (ref 0.0–1.2)
BUN/Creatinine Ratio: 15 (ref 12–28)
BUN: 14 mg/dL (ref 8–27)
CALCIUM: 9.5 mg/dL (ref 8.7–10.3)
CO2: 24 mmol/L (ref 20–29)
CREATININE: 0.96 mg/dL (ref 0.57–1.00)
Chloride: 99 mmol/L (ref 96–106)
GFR calc non Af Amer: 62 mL/min/{1.73_m2} (ref >59)
GFR, EST AFRICAN AMERICAN: 71 mL/min/{1.73_m2} (ref >59)
GLUCOSE: 126 mg/dL — AB (ref 65–99)
Globulin, Total: 2.4 g/dL (ref 1.5–4.5)
POTASSIUM: 4.1 mmol/L (ref 3.5–5.2)
Sodium: 137 mmol/L (ref 134–144)
TOTAL PROTEIN: 6.5 g/dL (ref 6.0–8.5)

## 2018-05-27 LAB — BAYER DCA HB A1C WAIVED: HB A1C: 8.8 % — AB (ref <7.0)

## 2018-05-27 MED ORDER — ATORVASTATIN CALCIUM 40 MG PO TABS: 40.0000 mg | ORAL_TABLET | Freq: Every day | ORAL | 1 refills | Status: DC

## 2018-05-27 MED ORDER — GLIPIZIDE 5 MG PO TABS: 5.0000 mg | ORAL_TABLET | Freq: Two times a day (BID) | ORAL | 1 refills | Status: DC

## 2018-05-27 MED ORDER — LOSARTAN POTASSIUM 100 MG PO TABS: 100.0000 mg | ORAL_TABLET | Freq: Every day | ORAL | 1 refills | Status: DC

## 2018-05-27 MED ORDER — SAXAGLIPTIN-METFORMIN ER 5-1000 MG PO TB24: 1.0000 | ORAL_TABLET | Freq: Every day | ORAL | 1 refills | Status: DC

## 2018-05-27 NOTE — Patient Instructions (Signed)

## 2018-05-27 NOTE — Progress Notes (Signed)
Subjective:    Patient ID: Kara Cortez, female    DOB: [birth year], 67 y.o.   MRN: [id number]   Chief Complaint: Medical Management of chronic Issues  HPI:  1. Type 2 diabetes mellitus without complication, without long-term current use of insulin (HCC) hgba1c 7.9%. Blood sugars have been all over the place, but she does not check them very often.  2. Essential hypertension  No c/o chest pain, sob or headache. Does not check blood pressure at home. BP Readings from Last 3 Encounters:  04/19/18 (!) 141/86  02/21/18 (!) 142/90  11/20/17 130/84     3. Mixed hyperlipidemia  She tries to avoid fried foods. Does some exercise but not much  4. BMI 29.0-29.9,adult  no recent weight changes  5. Seasonal allergic rhinitis due to pollen  Currently not taking anything and is doing well.    Outpatient Encounter Medications as of 05/27/2018  Medication Sig  . aspirin 81 MG tablet Take 81 mg by mouth daily.  Marland Kitchen atorvastatin (LIPITOR) 40 MG tablet Take 1 tablet (40 mg total) by mouth daily.  Marland Kitchen azithromycin (ZITHROMAX) 250 MG tablet Take 2 tablets on day 1 and 1 tablet daily after that  . glipiZIDE (GLUCOTROL) 5 MG tablet Take 1 tablet (5 mg total) by mouth 2 (two) times daily before a meal.  . HYDROcodone-homatropine (HYCODAN) 5-1.5 MG/5ML syrup Take 5 mLs by mouth every 6 (six) hours as needed for cough.  Marland Kitchen KOMBIGLYZE XR 01-999 MG TB24 TAKE 1 TABLET BY MOUTH EVERY DAY  . losartan (COZAAR) 50 MG tablet Take 1 tablet (50 mg total) by mouth daily.  Glory Rosebush DELICA LANCETS FINE MISC Test 1 x a day and prn  Dx E11.9  . predniSONE (DELTASONE) 20 MG tablet 2 po at sametime daily for 5 days       New complaints: None today  Social history: Works for [employer]   Review of Systems  Constitutional: Negative for activity change and appetite change.  HENT: Negative.   Eyes: Negative for pain.  Respiratory: Negative for shortness of breath.   Cardiovascular: Negative for  chest pain, palpitations and leg swelling.  Gastrointestinal: Negative for abdominal pain.  Endocrine: Negative for polydipsia.  Genitourinary: Negative.   Skin: Negative for rash.  Neurological: Negative for dizziness, weakness and headaches.  Hematological: Does not bruise/bleed easily.  Psychiatric/Behavioral: Negative.   All other systems reviewed and are negative.      Objective:   Physical Exam  Constitutional: She is oriented to person, place, and time. She appears well-developed and well-nourished. No distress.  HENT:  Head: Normocephalic.  Nose: Nose normal.  Mouth/Throat: Oropharynx is clear and moist.  Eyes: Pupils are equal, round, and reactive to light. EOM are normal.  Neck: Normal range of motion. Neck supple. No JVD present. Carotid bruit is not present.  Cardiovascular: Normal rate, regular rhythm, normal heart sounds and intact distal pulses.  Pulmonary/Chest: Effort normal and breath sounds normal. No respiratory distress. She has no wheezes. She has no rales. She exhibits no tenderness.  Abdominal: Soft. Normal appearance, normal aorta and bowel sounds are normal. She exhibits no distension, no abdominal bruit, no pulsatile midline mass and no mass. There is no splenomegaly or hepatomegaly. There is no tenderness.  Musculoskeletal: Normal range of motion. She exhibits no edema.  Lymphadenopathy:    She has no cervical adenopathy.  Neurological: She is alert and oriented to person, place, and time. She has normal reflexes.  Skin: Skin is  warm and dry.  Psychiatric: She has a normal mood and affect. Her behavior is normal. Judgment and thought content normal.   BP (!) 148/88 (BP Location: Left Arm, Cuff Size: Normal)   Pulse 75   Temp 97.7 F (36.5 C) (Oral)   Ht '5\' 4"'  (1.626 m)   Wt 175 lb 12.8 oz (79.7 kg)   BMI 30.18 kg/m   Hgba1c 8.8%        Assessment & Plan:  Kara Cortez comes in today with chief complaint of Medical Management of Chronic  Issues   Diagnosis and orders addressed:  1. Type 2 diabetes mellitus without complication, without long-term current use of insulin (HCC) Stricter carb counting Does not want o change meds -would like to try and get HGBA1c down with diet.  - Bayer DCA Hb A1c Waived - CMP14+EGFR - Saxagliptin-Metformin (KOMBIGLYZE XR) 01-999 MG TB24; Take 1 tablet by mouth daily.  Dispense: 90 tablet; Refill: 1 - glipiZIDE (GLUCOTROL) 5 MG tablet; Take 1 tablet (5 mg total) by mouth 2 (two) times daily before a meal.  Dispense: 180 tablet; Refill: 1  2. Essential hypertension Low sodium diet - losartan (COZAAR) 100 MG tablet; Take 1 tablet (100 mg total) by mouth daily.  Dispense: 90 tablet; Refill: 1  3. Mixed hyperlipidemia Low fat diet - atorvastatin (LIPITOR) 40 MG tablet; Take 1 tablet (40 mg total) by mouth daily.  Dispense: 90 tablet; Refill: 1  4. BMI 29.0-29.9,adult Discussed diet and exercise for person with BMI >25 Will recheck weight in 3-6 months  5. Seasonal allergic rhinitis due to pollen  * will schedule mammogram Labs pending Health Maintenance reviewed Diet and exercise encouraged  Follow up plan: 3 months   Mary-Margaret Hassell Done, FNP

## 2018-06-11 LAB — HM MAMMOGRAPHY

## 2018-08-27 ENCOUNTER — Encounter: Payer: Self-pay | Admitting: Nurse Practitioner

## 2018-08-27 ENCOUNTER — Ambulatory Visit: Payer: BLUE CROSS/BLUE SHIELD | Admitting: Nurse Practitioner

## 2018-08-27 VITALS — BP 152/91 | HR 87 | Temp 100.6°F | Ht 64.0 in | Wt 174.0 lb

## 2018-08-27 DIAGNOSIS — E782 Mixed hyperlipidemia: Secondary | ICD-10-CM | POA: Diagnosis not present

## 2018-08-27 DIAGNOSIS — I10 Essential (primary) hypertension: Secondary | ICD-10-CM | POA: Diagnosis not present

## 2018-08-27 DIAGNOSIS — E119 Type 2 diabetes mellitus without complications: Secondary | ICD-10-CM

## 2018-08-27 DIAGNOSIS — Z6829 Body mass index (BMI) 29.0-29.9, adult: Secondary | ICD-10-CM | POA: Diagnosis not present

## 2018-08-27 DIAGNOSIS — J0100 Acute maxillary sinusitis, unspecified: Secondary | ICD-10-CM

## 2018-08-27 LAB — BAYER DCA HB A1C WAIVED: HB A1C: 7.8 % — AB (ref <7.0)

## 2018-08-27 MED ORDER — SAXAGLIPTIN-METFORMIN ER 5-1000 MG PO TB24: 1.0000 | ORAL_TABLET | Freq: Every day | ORAL | 1 refills | Status: DC

## 2018-08-27 MED ORDER — ATORVASTATIN CALCIUM 40 MG PO TABS: 40.0000 mg | ORAL_TABLET | Freq: Every day | ORAL | 1 refills | Status: DC

## 2018-08-27 MED ORDER — GLIPIZIDE 5 MG PO TABS: 5.0000 mg | ORAL_TABLET | Freq: Two times a day (BID) | ORAL | 1 refills | Status: DC

## 2018-08-27 MED ORDER — FREESTYLE LIBRE 14 DAY READER DEVI: 1.0000 | Freq: Every day | 0 refills | Status: DC

## 2018-08-27 MED ORDER — DOXYCYCLINE HYCLATE 100 MG PO TABS: 100.0000 mg | ORAL_TABLET | Freq: Two times a day (BID) | ORAL | 0 refills | Status: DC

## 2018-08-27 MED ORDER — FREESTYLE LIBRE 14 DAY SENSOR MISC: 5 refills | Status: DC

## 2018-08-27 MED ORDER — LOSARTAN POTASSIUM 100 MG PO TABS: 100.0000 mg | ORAL_TABLET | Freq: Every day | ORAL | 1 refills | Status: DC

## 2018-08-27 NOTE — Progress Notes (Signed)
Subjective:    Patient ID: Kara Cortez, female    DOB: 1951-04-22, 67 y.o.   MRN: [id number]   Chief Complaint: medical management of chronic issues  HPI:  1. Type 2 diabetes mellitus without complication, without long-term current use of insulin ([place]) last hgba1c was 8.8%. Patient refused changes to medication at last visit. Wanted to try diet control. She has not been checking her blood sugars and has not ben watching diet.  2. Essential hypertension  No c/o chest pain, sob or headache. Does not check blood pressure at home.  3. Mixed hyperlipidemia  Does not watch diet and does no exercise  4. BMI 29.0-29.9,adult  No recent weight chnages    Outpatient Encounter Medications as of 08/27/2018  Medication Sig  . aspirin 81 MG tablet Take 81 mg by mouth daily.  Marland Kitchen atorvastatin (LIPITOR) 40 MG tablet Take 1 tablet (40 mg total) by mouth daily.  Marland Kitchen glipiZIDE (GLUCOTROL) 5 MG tablet Take 1 tablet (5 mg total) by mouth 2 (two) times daily before a meal.  . losartan (COZAAR) 100 MG tablet Take 1 tablet (100 mg total) by mouth daily.  Kara Cortez DELICA LANCETS FINE MISC Test 1 x a day and prn  Dx E11.9  . Saxagliptin-Metformin (KOMBIGLYZE XR) 01-999 MG TB24 Take 1 tablet by mouth daily.       New complaints: patient is in c/o chills for 2 days. Developed a fever today and has lots of nasal and sinus congestion.      Review of Systems  Constitutional: Positive for chills, fatigue and fever.  HENT: Positive for congestion, rhinorrhea, sinus pressure and sinus pain. Negative for sore throat and trouble swallowing.   Respiratory: Positive for cough. Negative for shortness of breath.   Cardiovascular: Negative.   Gastrointestinal: Negative.   Genitourinary: Negative.   Musculoskeletal: Negative.   Neurological: Positive for headaches.  Psychiatric/Behavioral: Negative.   All other systems reviewed and are negative.      Objective:   Physical Exam  Constitutional: She  is oriented to person, place, and time. She appears well-developed and well-nourished. No distress.  HENT:  Head: Normocephalic.  Right Ear: Hearing, tympanic membrane, external ear and ear canal normal.  Left Ear: Hearing, tympanic membrane, external ear and ear canal normal.  Nose: Mucosal edema and rhinorrhea present. Right sinus exhibits maxillary sinus tenderness. Right sinus exhibits no frontal sinus tenderness. Left sinus exhibits maxillary sinus tenderness. Left sinus exhibits no frontal sinus tenderness.  Mouth/Throat: Uvula is midline, oropharynx is clear and moist and mucous membranes are normal.  Eyes: Pupils are equal, round, and reactive to light. EOM are normal.  Neck: Normal range of motion. Neck supple. No JVD present. Carotid bruit is not present.  Cardiovascular: Normal rate, regular rhythm, normal heart sounds and intact distal pulses.  Pulmonary/Chest: Effort normal and breath sounds normal. No respiratory distress. She has no wheezes. She has no rales. She exhibits no tenderness.  Abdominal: Soft. Normal appearance, normal aorta and bowel sounds are normal. She exhibits no distension, no abdominal bruit, no pulsatile midline mass and no mass. There is no splenomegaly or hepatomegaly. There is no tenderness.  Musculoskeletal: Normal range of motion. She exhibits no edema.  Lymphadenopathy:    She has no cervical adenopathy.  Neurological: She is alert and oriented to person, place, and time. She has normal reflexes.  Skin: Skin is warm and dry.  Psychiatric: She has a normal mood and affect. Her behavior is normal. Judgment and  thought content normal.  Nursing note and vitals reviewed.   BP (!) 152/91   Pulse 87   Temp (!) 100.6 F (38.1 C) (Oral)   Ht _0  (1.626 m)   Wt 174 lb (78.9 kg)   BMI 29.87 kg/m        Assessment & Plan:  Kara Cortez comes in today with chief complaint of Medical Management of Chronic Issues   Diagnosis and orders  addressed:  1. Type 2 diabetes mellitus without complication, without long-term current use of insulin ([place]) Stricter carb counting rx libre so patient will check blood sugars more freqently - Bayer DCA Hb A1c Waived - Continuous Blood Gluc Receiver (FREESTYLE LIBRE 14 DAY READER) DEVI; 1 Device by Does not apply route daily.  Dispense: 1 Device; Refill: 0 - Continuous Blood Gluc Sensor (FREESTYLE LIBRE 14 DAY SENSOR) MISC; 1 each by Does not apply route daily for 14 days, THEN 1 each daily for 14 days. Change device every 14 days.  Dispense: 2 each; Refill: 5 - glipiZIDE (GLUCOTROL) 5 MG tablet; Take 1 tablet (5 mg total) by mouth 2 (two) times daily before a meal.  Dispense: 180 tablet; Refill: 1 - Saxagliptin-Metformin (KOMBIGLYZE XR) 01-999 MG TB24; Take 1 tablet by mouth daily.  Dispense: 90 tablet; Refill: 1  2. Essential hypertension Low sodium diet - CMP14+EGFR - losartan (COZAAR) 100 MG tablet; Take 1 tablet (100 mg total) by mouth daily.  Dispense: 90 tablet; Refill: 1  3. Mixed hyperlipidemia Low fat diet - Lipid panel - atorvastatin (LIPITOR) 40 MG tablet; Take 1 tablet (40 mg total) by mouth daily.  Dispense: 90 tablet; Refill: 1  4. BMI 29.0-29.9,adult Discussed diet and exercise for person with BMI >25 Will recheck weight in 3-6 months  5. Acute maxillary sinusitis, recurrence not specified 1. Take meds as prescribed 2. Use a cool mist humidifier especially during the winter months and when heat has been humid. 3. Use saline nose sprays frequently 4. Saline irrigations of the nose can be very helpful if done frequently.  * 4X daily for 1 week*  * Use of a nettie pot can be helpful with this. Follow directions with this* 5. Drink plenty of fluids 6. Keep thermostat turn down low 7.For any cough or congestion  Use plain Mucinex- regular strength or max strength is fine   * Children- consult with Pharmacist for dosing 8. For fever or aces or pains- take tylenol or  ibuprofen appropriate for age and weight.  * for fevers greater than 101 orally you may alternate ibuprofen and tylenol every  3 hours.   - doxycycline 194m bid for 10 days #20 0 refills   Labs pending Health Maintenance reviewed Diet and exercise encouraged  Follow up plan: 3 months   Mary-Margaret MHassell Done FNP

## 2018-08-27 NOTE — Patient Instructions (Signed)

## 2018-08-28 LAB — CMP14+EGFR
ALT: 14 IU/L (ref 0–32)
AST: 17 IU/L (ref 0–40)
Albumin/Globulin Ratio: 2.4 — ABNORMAL HIGH (ref 1.2–2.2)
Albumin: 4.5 g/dL (ref 3.6–4.8)
Alkaline Phosphatase: 86 IU/L (ref 39–117)
BUN/Creatinine Ratio: 14 (ref 12–28)
BUN: 11 mg/dL (ref 8–27)
Bilirubin Total: 0.2 mg/dL (ref 0.0–1.2)
CALCIUM: 9 mg/dL (ref 8.7–10.3)
CHLORIDE: 102 mmol/L (ref 96–106)
CO2: 23 mmol/L (ref 20–29)
Creatinine, Ser: 0.8 mg/dL (ref 0.57–1.00)
GFR calc Af Amer: 89 mL/min/{1.73_m2} (ref >59)
GFR calc non Af Amer: 77 mL/min/{1.73_m2} (ref >59)
Globulin, Total: 1.9 g/dL (ref 1.5–4.5)
Glucose: 97 mg/dL (ref 65–99)
POTASSIUM: 3.9 mmol/L (ref 3.5–5.2)
Sodium: 142 mmol/L (ref 134–144)
Total Protein: 6.4 g/dL (ref 6.0–8.5)

## 2018-08-28 LAB — LIPID PANEL
CHOL/HDL RATIO: 2.2 ratio (ref 0.0–4.4)
Cholesterol, Total: 146 mg/dL (ref 100–199)
HDL: 66 mg/dL (ref >39)
LDL CALC: 57 mg/dL (ref 0–99)
TRIGLYCERIDES: 115 mg/dL (ref 0–149)
VLDL Cholesterol Cal: 23 mg/dL (ref 5–40)

## 2018-09-03 ENCOUNTER — Telehealth: Payer: Self-pay | Admitting: Nurse Practitioner

## 2018-09-03 NOTE — Telephone Encounter (Signed)
Pt aware of provider feedback and voiced understanding. 

## 2018-09-03 NOTE — Telephone Encounter (Signed)
If doxy is not working then it is viral and will have to run its course.

## 2018-11-28 ENCOUNTER — Ambulatory Visit: Payer: BLUE CROSS/BLUE SHIELD | Admitting: Nurse Practitioner

## 2018-12-13 ENCOUNTER — Telehealth: Payer: Self-pay | Admitting: Nurse Practitioner

## 2018-12-13 MED ORDER — OLMESARTAN MEDOXOMIL 40 MG PO TABS: 40.0000 mg | ORAL_TABLET | Freq: Every day | ORAL | 2 refills | Status: DC

## 2018-12-13 NOTE — Telephone Encounter (Signed)
Losartan changed to benicar

## 2019-02-14 ENCOUNTER — Ambulatory Visit (INDEPENDENT_AMBULATORY_CARE_PROVIDER_SITE_OTHER): Payer: BLUE CROSS/BLUE SHIELD | Admitting: Nurse Practitioner

## 2019-02-14 ENCOUNTER — Other Ambulatory Visit: Payer: Self-pay

## 2019-02-14 ENCOUNTER — Encounter: Payer: Self-pay | Admitting: Nurse Practitioner

## 2019-02-14 DIAGNOSIS — Z6829 Body mass index (BMI) 29.0-29.9, adult: Secondary | ICD-10-CM | POA: Diagnosis not present

## 2019-02-14 DIAGNOSIS — I10 Essential (primary) hypertension: Secondary | ICD-10-CM | POA: Diagnosis not present

## 2019-02-14 DIAGNOSIS — E782 Mixed hyperlipidemia: Secondary | ICD-10-CM

## 2019-02-14 DIAGNOSIS — E119 Type 2 diabetes mellitus without complications: Secondary | ICD-10-CM

## 2019-02-14 MED ORDER — OLMESARTAN MEDOXOMIL 40 MG PO TABS: 40.0000 mg | ORAL_TABLET | Freq: Every day | ORAL | 1 refills | Status: DC

## 2019-02-14 MED ORDER — SAXAGLIPTIN-METFORMIN ER 5-1000 MG PO TB24: 1.0000 | ORAL_TABLET | Freq: Every day | ORAL | 1 refills | Status: DC

## 2019-02-14 MED ORDER — ATORVASTATIN CALCIUM 40 MG PO TABS: 40.0000 mg | ORAL_TABLET | Freq: Every day | ORAL | 1 refills | Status: DC

## 2019-02-14 MED ORDER — GLIPIZIDE 5 MG PO TABS: 5.0000 mg | ORAL_TABLET | Freq: Two times a day (BID) | ORAL | 1 refills | Status: DC

## 2019-02-14 NOTE — Progress Notes (Signed)
Virtual Visit via telephone Note  I connected with Kara Cortez on 02/14/19 at 8:20 AM by telephone and verified that I am speaking with the correct person using two identifiers. Kara Cortez is currently located at home and no one is currently with her during visit. The provider, Mary-Margaret Hassell Done, FNP is located in their office at time of visit.  I discussed the limitations, risks, security and privacy concerns of performing an evaluation and management service by telephone and the availability of in person appointments. I also discussed with the patient that there may be a patient responsible charge related to this service. The patient expressed understanding and agreed to proceed.   History and Present Illness:   Chief Complaint: medical managemnet of chronic issues   HPI:  1. Essential hypertension No c/o chest pain, sob or headache. Does not check blood pressure at home. BP Readings from Last 3 Encounters:  08/27/18 (!) 152/91  05/27/18 (!) 148/88  04/19/18 (!) 141/86     2. Type 2 diabetes mellitus without complication, without long-term current use of insulin (HCC) Last HGBA1c was 7.8%. she does not check blood sugars at home. She is trying to watch diet and does very little exercise.  Denies any hypoglycemis.  3. Mixed hyperlipidemia Not watching diet and not exercising  4. BMI 29.0-29.9,adult No weight changes.    Outpatient Encounter Medications as of 02/14/2019  Medication Sig  . aspirin 81 MG tablet Take 81 mg by mouth daily.  Marland Kitchen atorvastatin (LIPITOR) 40 MG tablet Take 1 tablet (40 mg total) by mouth daily.  . Continuous Blood Gluc Receiver (FREESTYLE LIBRE 14 DAY READER) DEVI 1 Device by Does not apply route daily.  Marland Kitchen doxycycline (VIBRA-TABS) 100 MG tablet Take 1 tablet (100 mg total) by mouth 2 (two) times daily. 1 po bid  . glipiZIDE (GLUCOTROL) 5 MG tablet Take 1 tablet (5 mg total) by mouth 2 (two) times daily before a meal.  . olmesartan  (BENICAR) 40 MG tablet Take 1 tablet (40 mg total) by mouth daily.  Glory Rosebush DELICA LANCETS FINE MISC Test 1 x a day and prn  Dx E11.9  . Saxagliptin-Metformin (KOMBIGLYZE XR) 01-999 MG TB24 Take 1 tablet by mouth daily.      New complaints: None today  Social history: Lives with husband who is retired.     Review of Systems  Constitutional: Negative for diaphoresis and weight loss.  Eyes: Negative for blurred vision, double vision and pain.  Respiratory: Negative for shortness of breath.   Cardiovascular: Negative for chest pain, palpitations, orthopnea and leg swelling.  Gastrointestinal: Negative for abdominal pain.  Skin: Negative for rash.  Neurological: Negative for dizziness, sensory change, loss of consciousness, weakness and headaches.  Endo/Heme/Allergies: Negative for polydipsia. Does not bruise/bleed easily.  Psychiatric/Behavioral: Negative for memory loss. The patient does not have insomnia.   All other systems reviewed and are negative.    Observations/Objective: Alert and oriented- answers all questions appropriately No distress  Assessment and Plan: Kara Cortez comes in today with chief complaint of No chief complaint on file.   Diagnosis and orders addressed:  1. Essential hypertension Low sodium diet - olmesartan (BENICAR) 40 MG tablet; Take 1 tablet (40 mg total) by mouth daily.  Dispense: 90 tablet; Refill: 1  2. Type 2 diabetes mellitus without complication, without long-term current use of insulin (HCC) Carb counting encouraged - glipiZIDE (GLUCOTROL) 5 MG tablet; Take 1 tablet (5 mg total) by mouth 2 (two)  times daily before a meal.  Dispense: 180 tablet; Refill: 1 - Saxagliptin-Metformin (KOMBIGLYZE XR) 01-999 MG TB24; Take 1 tablet by mouth daily.  Dispense: 90 tablet; Refill: 1  3. Mixed hyperlipidemia Low fat diet - atorvastatin (LIPITOR) 40 MG tablet; Take 1 tablet (40 mg total) by mouth daily.  Dispense: 90 tablet; Refill: 1   4. BMI 29.0-29.9,adult Discussed diet and exercise for person with BMI >25 Will recheck weight in 3-6 months   Previous lab results reviewed Health Maintenance reviewed Diet and exercise encouraged  Follow up plan: 3 months     I discussed the assessment and treatment plan with the patient. The patient was provided an opportunity to ask questions and all were answered. The patient agreed with the plan and demonstrated an understanding of the instructions.   The patient was advised to call back or seek an in-person evaluation if the symptoms worsen or if the condition fails to improve as anticipated.  The above assessment and management plan was discussed with the patient. The patient verbalized understanding of and has agreed to the management plan. Patient is aware to call the clinic if symptoms persist or worsen. Patient is aware when to return to the clinic for a follow-up visit. Patient educated on when it is appropriate to go to the emergency department.   Time call ended:  8:37 AM  I provided 20 minutes of non-face-to-face time during this encounter.    Mary-Margaret Hassell Done, FNP

## 2019-05-19 ENCOUNTER — Ambulatory Visit: Payer: BLUE CROSS/BLUE SHIELD | Admitting: Nurse Practitioner

## 2019-06-20 ENCOUNTER — Ambulatory Visit: Payer: BLUE CROSS/BLUE SHIELD | Admitting: Nurse Practitioner

## 2019-07-25 ENCOUNTER — Encounter: Payer: Self-pay | Admitting: Nurse Practitioner

## 2019-07-25 ENCOUNTER — Ambulatory Visit (INDEPENDENT_AMBULATORY_CARE_PROVIDER_SITE_OTHER): Payer: BLUE CROSS/BLUE SHIELD | Admitting: Nurse Practitioner

## 2019-07-25 ENCOUNTER — Other Ambulatory Visit: Payer: Self-pay

## 2019-07-25 VITALS — BP 134/86 | HR 66 | Temp 98.6°F | Resp 20 | Ht 64.0 in | Wt 172.0 lb

## 2019-07-25 DIAGNOSIS — I10 Essential (primary) hypertension: Secondary | ICD-10-CM | POA: Diagnosis not present

## 2019-07-25 DIAGNOSIS — E782 Mixed hyperlipidemia: Secondary | ICD-10-CM | POA: Diagnosis not present

## 2019-07-25 DIAGNOSIS — Z23 Encounter for immunization: Secondary | ICD-10-CM | POA: Diagnosis not present

## 2019-07-25 DIAGNOSIS — Z6829 Body mass index (BMI) 29.0-29.9, adult: Secondary | ICD-10-CM | POA: Diagnosis not present

## 2019-07-25 DIAGNOSIS — E119 Type 2 diabetes mellitus without complications: Secondary | ICD-10-CM | POA: Diagnosis not present

## 2019-07-25 LAB — BAYER DCA HB A1C WAIVED: HB A1C (BAYER DCA - WAIVED): 9.8 % — ABNORMAL HIGH (ref <7.0)

## 2019-07-25 MED ORDER — KOMBIGLYZE XR 5-1000 MG PO TB24: 1.0000 | ORAL_TABLET | Freq: Every day | ORAL | 1 refills | Status: DC

## 2019-07-25 MED ORDER — ATORVASTATIN CALCIUM 40 MG PO TABS: 40.0000 mg | ORAL_TABLET | Freq: Every day | ORAL | 1 refills | Status: DC

## 2019-07-25 MED ORDER — OLMESARTAN MEDOXOMIL 40 MG PO TABS: 40.0000 mg | ORAL_TABLET | Freq: Every day | ORAL | 1 refills | Status: DC

## 2019-07-25 MED ORDER — GLIPIZIDE 5 MG PO TABS: 5.0000 mg | ORAL_TABLET | Freq: Two times a day (BID) | ORAL | 1 refills | Status: DC

## 2019-07-25 NOTE — Addendum Note (Signed)
Addended by: Rolena Infante on: 07/25/2019 03:02 PM   Modules accepted: Orders

## 2019-07-25 NOTE — Progress Notes (Signed)
Subjective:    Patient ID: Kara Cortez, female    DOB: 01/10/1951, 68 y.o.   MRN: [id number]   Chief Complaint: Medical Management of Chronic Issues    HPI:  1. Essential hypertension No c/o chest pain, sob or headache. Does not check blood pressure at home. BP Readings from Last 3 Encounters:  08/27/18 (!) 152/91  05/27/18 (!) 148/88  04/19/18 (!) 141/86     2. Type 2 diabetes mellitus without complication, without long-term current use of insulin (HCC) She does not check hr blood sugars unless she just feels bad. She is not been taking the kombiglyze for th elast 2 months because she miss placed the meds. Lab Results  Component Value Date   HGBA1C 7.8 (H) 08/27/2018     3. Mixed hyperlipidemia Tries to watch diet but does very little exercise. Lab Results  Component Value Date   CHOL 146 08/27/2018   HDL 66 08/27/2018   LDLCALC 57 08/27/2018   TRIG 115 08/27/2018   CHOLHDL 2.2 08/27/2018     4. BMI 29.0-29.9,adult No recent weight change Wt Readings from Last 3 Encounters:  08/27/18 174 lb (78.9 kg)  05/27/18 175 lb 12.8 oz (79.7 kg)  04/19/18 174 lb 3.2 oz (79 kg)   BMI Readings from Last 3 Encounters:  08/27/18 29.87 kg/m  05/27/18 30.18 kg/m  04/19/18 29.90 kg/m       Outpatient Encounter Medications as of 07/25/2019  Medication Sig  . aspirin 81 MG tablet Take 81 mg by mouth daily.  Marland Kitchen atorvastatin (LIPITOR) 40 MG tablet Take 1 tablet (40 mg total) by mouth daily.  . Continuous Blood Gluc Receiver (FREESTYLE LIBRE 14 DAY READER) DEVI 1 Device by Does not apply route daily.  Marland Kitchen glipiZIDE (GLUCOTROL) 5 MG tablet Take 1 tablet (5 mg total) by mouth 2 (two) times daily before a meal.  . olmesartan (BENICAR) 40 MG tablet Take 1 tablet (40 mg total) by mouth daily.  Glory Rosebush DELICA LANCETS FINE MISC Test 1 x a day and prn  Dx E11.9  . Saxagliptin-Metformin (KOMBIGLYZE XR) 01-999 MG TB24 Take 1 tablet by mouth daily.     Past Surgical  History:  Procedure Laterality Date  . BUNIONECTOMY    . TONSILLECTOMY      Family History  Problem Relation Age of Onset  . Diabetes Mother   . Hypertension Mother   . Heart disease Mother   . Hyperlipidemia Father   . Cancer Father        bladder  . Emphysema Father     New complaints: None today  Social history: lives with her husband- is getting ready to retire.  Controlled substance contract: n/a    Review of Systems  Constitutional: Negative for activity change and appetite change.  HENT: Negative.   Eyes: Negative for pain.  Respiratory: Negative for shortness of breath.   Cardiovascular: Negative for chest pain, palpitations and leg swelling.  Gastrointestinal: Negative for abdominal pain.  Endocrine: Negative for polydipsia.  Genitourinary: Negative.   Skin: Negative for rash.  Neurological: Negative for dizziness, weakness and headaches.  Hematological: Does not bruise/bleed easily.  Psychiatric/Behavioral: Negative.   All other systems reviewed and are negative.      Objective:   Physical Exam Vitals signs and nursing note reviewed.  Constitutional:      General: She is not in acute distress.    Appearance: Normal appearance. She is well-developed.  HENT:     Head: Normocephalic.  Nose: Nose normal.  Eyes:     Pupils: Pupils are equal, round, and reactive to light.  Neck:     Musculoskeletal: Normal range of motion and neck supple.     Vascular: No carotid bruit or JVD.  Cardiovascular:     Rate and Rhythm: Normal rate and regular rhythm.     Heart sounds: Normal heart sounds.  Pulmonary:     Effort: Pulmonary effort is normal. No respiratory distress.     Breath sounds: Normal breath sounds. No wheezing or rales.  Chest:     Chest wall: No tenderness.  Abdominal:     General: Bowel sounds are normal. There is no distension or abdominal bruit.     Palpations: Abdomen is soft. There is no hepatomegaly, splenomegaly, mass or pulsatile  mass.     Tenderness: There is no abdominal tenderness.  Musculoskeletal: Normal range of motion.  Lymphadenopathy:     Cervical: No cervical adenopathy.  Skin:    General: Skin is warm and dry.  Neurological:     Mental Status: She is alert and oriented to person, place, and time.     Deep Tendon Reflexes: Reflexes are normal and symmetric.  Psychiatric:        Behavior: Behavior normal.        Thought Content: Thought content normal.        Judgment: Judgment normal.    BP 134/86 (BP Location: Left Arm, Cuff Size: Normal)   Pulse 66   Temp 98.6 F (37 C) (Temporal)   Resp 20   Ht 5' 4" (1.626 m)   Wt 172 lb (78 kg)   SpO2 98%   BMI 29.52 kg/m   hgba1c 9.8     Assessment & Plan:  Kara Cortez comes in today with chief complaint of Medical Management of Chronic Issues (would like to be checked for alpha gal)   Diagnosis and orders addressed:  1. Essential hypertension Low sodium diet - CMP14+EGFR  2. Type 2 diabetes mellitus without complication, without long-term current use of insulin (HCC) Stricter carb counting Get back on kombiglize - Bayer DCA Hb A1c Waived - Microalbumin / creatinine urine ratio  3. Mixed hyperlipidemia Low fat diet - Lipid panel  4. BMI 29.0-29.9,adult Discussed diet and exercise for person with BMI >25 Will recheck weight in 3-6 months   Labs pending Health Maintenance reviewed Diet and exercise encouraged  Follow up plan: 1 month   [place], FNP

## 2019-07-25 NOTE — Patient Instructions (Signed)
Carbohydrate Counting for Diabetes Mellitus, Adult  Carbohydrate counting is a method of keeping track of how many carbohydrates you eat. Eating carbohydrates naturally increases the amount of sugar (glucose) in the blood. Counting how many carbohydrates you eat helps keep your blood glucose within normal limits, which helps you manage your diabetes (diabetes mellitus). It is important to know how many carbohydrates you can safely have in each meal. This is different for every person. A diet and nutrition specialist (registered dietitian) can help you make a meal plan and calculate how many carbohydrates you should have at each meal and snack. Carbohydrates are found in the following foods:  Grains, such as breads and cereals.  Dried beans and soy products.  Starchy vegetables, such as potatoes, peas, and corn.  Fruit and fruit juices.  Milk and yogurt.  Sweets and snack foods, such as cake, cookies, candy, chips, and soft drinks. How do I count carbohydrates? There are two ways to count carbohydrates in food. You can use either of the methods or a combination of both. Reading "Nutrition Facts" on packaged food The "Nutrition Facts" list is included on the labels of almost all packaged foods and beverages in the [place] It includes:  The serving size.  Information about nutrients in each serving, including the grams (g) of carbohydrate per serving. To use the "Nutrition Facts":  Decide how many servings you will have.  Multiply the number of servings by the number of carbohydrates per serving.  The resulting number is the total amount of carbohydrates that you will be having. Learning standard serving sizes of other foods When you eat carbohydrate foods that are not packaged or do not include "Nutrition Facts" on the label, you need to measure the servings in order to count the amount of carbohydrates:  Measure the foods that you will eat with a food scale or measuring cup, if needed.   Decide how many standard-size servings you will eat.  Multiply the number of servings by 15. Most carbohydrate-rich foods have about 15 g of carbohydrates per serving. ? For example, if you eat 8 oz (170 g) of strawberries, you will have eaten 2 servings and 30 g of carbohydrates (2 servings x 15 g = 30 g).  For foods that have more than one food mixed, such as soups and casseroles, you must count the carbohydrates in each food that is included. The following list contains standard serving sizes of common carbohydrate-rich foods. Each of these servings has about 15 g of carbohydrates:   hamburger bun or  English muffin.   oz (15 mL) syrup.   oz (14 g) jelly.  1 slice of bread.  1 six-inch tortilla.  3 oz (85 g) cooked rice or pasta.  4 oz (113 g) cooked dried beans.  4 oz (113 g) starchy vegetable, such as peas, corn, or potatoes.  4 oz (113 g) hot cereal.  4 oz (113 g) mashed potatoes or  of a large baked potato.  4 oz (113 g) canned or frozen fruit.  4 oz (120 mL) fruit juice.  4-6 crackers.  6 chicken nuggets.  6 oz (170 g) unsweetened dry cereal.  6 oz (170 g) plain fat-free yogurt or yogurt sweetened with artificial sweeteners.  8 oz (240 mL) milk.  8 oz (170 g) fresh fruit or one small piece of fruit.  24 oz (680 g) popped popcorn. Example of carbohydrate counting Sample meal  3 oz (85 g) chicken breast.  6 oz (170 g)   brown rice.  4 oz (113 g) corn.  8 oz (240 mL) milk.  8 oz (170 g) strawberries with sugar-free whipped topping. Carbohydrate calculation 1. Identify the foods that contain carbohydrates: ? Rice. ? Corn. ? Milk. ? Strawberries. 2. Calculate how many servings you have of each food: ? 2 servings rice. ? 1 serving corn. ? 1 serving milk. ? 1 serving strawberries. 3. Multiply each number of servings by 15 g: ? 2 servings rice x 15 g = 30 g. ? 1 serving corn x 15 g = 15 g. ? 1 serving milk x 15 g = 15 g. ? 1 serving  strawberries x 15 g = 15 g. 4. Add together all of the amounts to find the total grams of carbohydrates eaten: ? 30 g + 15 g + 15 g + 15 g = 75 g of carbohydrates total. Summary  Carbohydrate counting is a method of keeping track of how many carbohydrates you eat.  Eating carbohydrates naturally increases the amount of sugar (glucose) in the blood.  Counting how many carbohydrates you eat helps keep your blood glucose within normal limits, which helps you manage your diabetes.  A diet and nutrition specialist (registered dietitian) can help you make a meal plan and calculate how many carbohydrates you should have at each meal and snack. This information is not intended to replace advice given to you by your health care provider. Make sure you discuss any questions you have with your health care provider. Document Released: 09/18/2005 Document Revised: 04/12/2017 Document Reviewed: 03/01/2016 Elsevier Patient Education  2020 [employer]  

## 2019-07-26 LAB — CMP14+EGFR
ALT: 11 IU/L (ref 0–32)
AST: 16 IU/L (ref 0–40)
Albumin/Globulin Ratio: 1.6 (ref 1.2–2.2)
Albumin: 4.1 g/dL (ref 3.8–4.8)
Alkaline Phosphatase: 104 IU/L (ref 39–117)
BUN/Creatinine Ratio: 16 (ref 12–28)
BUN: 12 mg/dL (ref 8–27)
Bilirubin Total: 0.5 mg/dL (ref 0.0–1.2)
CO2: 26 mmol/L (ref 20–29)
Calcium: 9.2 mg/dL (ref 8.7–10.3)
Chloride: 101 mmol/L (ref 96–106)
Creatinine, Ser: 0.76 mg/dL (ref 0.57–1.00)
GFR calc Af Amer: 94 mL/min/{1.73_m2} (ref >59)
GFR calc non Af Amer: 81 mL/min/{1.73_m2} (ref >59)
Globulin, Total: 2.6 g/dL (ref 1.5–4.5)
Glucose: 161 mg/dL — ABNORMAL HIGH (ref 65–99)
Potassium: 4.2 mmol/L (ref 3.5–5.2)
Sodium: 141 mmol/L (ref 134–144)
Total Protein: 6.7 g/dL (ref 6.0–8.5)

## 2019-07-26 LAB — LIPID PANEL
Chol/HDL Ratio: 3.5 ratio (ref 0.0–4.4)
Cholesterol, Total: 221 mg/dL — ABNORMAL HIGH (ref 100–199)
HDL: 63 mg/dL (ref >39)
LDL Chol Calc (NIH): 129 mg/dL — ABNORMAL HIGH (ref 0–99)
Triglycerides: 168 mg/dL — ABNORMAL HIGH (ref 0–149)
VLDL Cholesterol Cal: 29 mg/dL (ref 5–40)

## 2019-07-28 LAB — HM DIABETES EYE EXAM

## 2019-08-22 ENCOUNTER — Other Ambulatory Visit: Payer: Self-pay

## 2019-08-25 ENCOUNTER — Other Ambulatory Visit: Payer: Self-pay

## 2019-08-25 ENCOUNTER — Ambulatory Visit: Payer: BLUE CROSS/BLUE SHIELD | Admitting: Nurse Practitioner

## 2019-08-25 ENCOUNTER — Encounter: Payer: Self-pay | Admitting: Nurse Practitioner

## 2019-08-25 VITALS — BP 159/96 | HR 64 | Temp 98.7°F | Resp 20 | Ht 64.0 in | Wt 171.0 lb

## 2019-08-25 DIAGNOSIS — E119 Type 2 diabetes mellitus without complications: Secondary | ICD-10-CM

## 2019-08-25 LAB — BAYER DCA HB A1C WAIVED: HB A1C (BAYER DCA - WAIVED): 8.1 % — ABNORMAL HIGH (ref <7.0)

## 2019-08-25 NOTE — Patient Instructions (Signed)
Blood Glucose Monitoring, Adult °Monitoring your blood sugar (glucose) is an important part of managing your diabetes (diabetes mellitus). Blood glucose monitoring involves checking your blood glucose as often as directed and keeping a record (log) of your results over time. °Checking your blood glucose regularly and keeping a blood glucose log can: °· Help you and your health care provider adjust your diabetes management plan as needed, including your medicines or insulin. °· Help you understand how food, exercise, illnesses, and medicines affect your blood glucose. °· Let you know what your blood glucose is at any time. You can quickly find out if you have low blood glucose (hypoglycemia) or high blood glucose (hyperglycemia). °Your health care provider will set individualized treatment goals for you. Your goals will be based on your age, other medical conditions you have, and how you respond to diabetes treatment. Generally, the goal of treatment is to maintain the following blood glucose levels: °· Before meals (preprandial): 80-130 mg/dL (4.4-7.2 mmol/L). °· After meals (postprandial): below 180 mg/dL (10 mmol/L). °· A1c level: less than 7%. °Supplies needed: °· Blood glucose meter. °· Test strips for your meter. Each meter has its own strips. You must use the strips that came with your meter. °· A needle to prick your finger (lancet). Do not use a lancet more than one time. °· A device that holds the lancet (lancing device). °· A journal or log book to write down your results. °How to check your blood glucose ° °1. Wash your hands with soap and water. °2. Prick the side of your finger (not the tip) with the lancet. Use a different finger each time. °3. Gently rub the finger until a small drop of blood appears. °4. Follow instructions that come with your meter for inserting the test strip, applying blood to the strip, and using your blood glucose meter. °5. Write down your result and any notes. °Some meters  allow you to use areas of your body other than your finger (alternative sites) to test your blood. The most common alternative sites are: °· Forearm. °· Thigh. °· Palm of the hand. °If you think you may have hypoglycemia, or if you have a history of not knowing when your blood glucose is getting low (hypoglycemia unawareness), do not use alternative sites. Use your finger instead. Alternative sites may not be as accurate as the fingers, because blood flow is slower in these areas. This means that the result you get may be delayed, and it may be different from the result that you would get from your finger. °Follow these instructions at home: °Blood glucose log ° °· Every time you check your blood glucose, write down your result. Also write down any notes about things that may be affecting your blood glucose, such as your diet and exercise for the day. This information can help you and your health care provider: °? Look for patterns in your blood glucose over time. °? Adjust your diabetes management plan as needed. °· Check if your meter allows you to download your records to a computer. Most glucose meters store a record of glucose readings in the meter. °If you have type 1 diabetes: °· Check your blood glucose 2 or more times a day. °· Also check your blood glucose: °? Before every insulin injection. °? Before and after exercise. °? Before meals. °? 2 hours after a meal. °? Occasionally between 2:00 a.m. and 3:00 a.m., as directed. °? Before potentially dangerous tasks, like driving or using heavy machinery. °?   At bedtime. °· You may need to check your blood glucose more often, up to 6-10 times a day, if you: °? Use an insulin pump. °? Need multiple daily injections (MDI). °? Have diabetes that is not well-controlled. °? Are ill. °? Have a history of severe hypoglycemia. °? Have hypoglycemia unawareness. °If you have type 2 diabetes: °· If you take insulin or other diabetes medicines, check your blood glucose 2 or  more times a day. °· If you are on intensive insulin therapy, check your blood glucose 4 or more times a day. Occasionally, you may also need to check between 2:00 a.m. and 3:00 a.m., as directed. °· Also check your blood glucose: °? Before and after exercise. °? Before potentially dangerous tasks, like driving or using heavy machinery. °· You may need to check your blood glucose more often if: °? Your medicine is being adjusted. °? Your diabetes is not well-controlled. °? You are ill. °General tips °· Always keep your supplies with you. °· If you have questions or need help, all blood glucose meters have a 24-hour "hotline" phone number that you can call. You may also contact your health care provider. °· After you use a few boxes of test strips, adjust (calibrate) your blood glucose meter by following instructions that came with your meter. °Contact a health care provider if: °· Your blood glucose is at or above 240 mg/dL (13.3 mmol/L) for 2 days in a row. °· You have been sick or have had a fever for 2 days or longer, and you are not getting better. °· You have any of the following problems for more than 6 hours: °? You cannot eat or drink. °? You have nausea or vomiting. °? You have diarrhea. °Get help right away if: °· Your blood glucose is lower than 54 mg/dL (3 mmol/L). °· You become confused or you have trouble thinking clearly. °· You have difficulty breathing. °· You have moderate or large ketone levels in your urine. °Summary °· Monitoring your blood sugar (glucose) is an important part of managing your diabetes (diabetes mellitus). °· Blood glucose monitoring involves checking your blood glucose as often as directed and keeping a record (log) of your results over time. °· Your health care provider will set individualized treatment goals for you. Your goals will be based on your age, other medical conditions you have, and how you respond to diabetes treatment. °· Every time you check your blood glucose,  write down your result. Also write down any notes about things that may be affecting your blood glucose, such as your diet and exercise for the day. °This information is not intended to replace advice given to you by your health care provider. Make sure you discuss any questions you have with your health care provider. °Document Released: 09/21/2003 Document Revised: 07/12/2018 Document Reviewed: 02/28/2016 °Elsevier Patient Education © 2020 [employer] ° °

## 2019-08-25 NOTE — Progress Notes (Signed)
Subjective:    Patient ID: Kara Cortez, female    DOB: September 18, 1951, 68 y.o.   MRN: [id number]   Chief Complaint: Diabetes   HPI Patient was seen on 07/25/19 for follow up of chronic medical issues. At that time she had not been checking hr blood sugars and she had not  been taking her meds because she had misplaced them. Her HGBA1c was 9.8. we put her back on her kombyligize and she was to return with a diary of her blood sugars. She has not been checking her blood sugars. She watched her diet for 1 week, but she has been tking her komblyglize.    Review of Systems  Constitutional: Negative for activity change and appetite change.  HENT: Negative.   Eyes: Negative for pain.  Respiratory: Negative for shortness of breath.   Cardiovascular: Negative for chest pain, palpitations and leg swelling.  Gastrointestinal: Negative for abdominal pain.  Endocrine: Negative for polydipsia.  Genitourinary: Negative.   Skin: Negative for rash.  Neurological: Negative for dizziness, weakness and headaches.  Hematological: Does not bruise/bleed easily.  Psychiatric/Behavioral: Negative.   All other systems reviewed and are negative.      Objective:   Physical Exam Vitals signs and nursing note reviewed.  Constitutional:      General: She is not in acute distress.    Appearance: Normal appearance. She is well-developed.  HENT:     Head: Normocephalic.     Nose: Nose normal.  Eyes:     Pupils: Pupils are equal, round, and reactive to light.  Neck:     Musculoskeletal: Normal range of motion and neck supple.     Vascular: No carotid bruit or JVD.  Cardiovascular:     Rate and Rhythm: Normal rate and regular rhythm.     Heart sounds: Normal heart sounds.  Pulmonary:     Effort: Pulmonary effort is normal. No respiratory distress.     Breath sounds: Normal breath sounds. No wheezing or rales.  Chest:     Chest wall: No tenderness.  Abdominal:     General: Bowel sounds are  normal. There is no distension or abdominal bruit.     Palpations: Abdomen is soft. There is no hepatomegaly, splenomegaly, mass or pulsatile mass.     Tenderness: There is no abdominal tenderness.  Musculoskeletal: Normal range of motion.  Lymphadenopathy:     Cervical: No cervical adenopathy.  Skin:    General: Skin is warm and dry.  Neurological:     Mental Status: She is alert and oriented to person, place, and time.     Deep Tendon Reflexes: Reflexes are normal and symmetric.  Psychiatric:        Behavior: Behavior normal.        Thought Content: Thought content normal.        Judgment: Judgment normal.     BP (!) 159/96   Pulse 64   Temp 98.7 F (37.1 C) (Temporal)   Resp 20   Ht 5\' 4"  (1.626 m)   Wt 171 lb (77.6 kg)   SpO2 98%   BMI 29.35 kg/m '  hgba1c 8.1    Assessment & Plan:  Kara Cortez comes in today with chief complaint of Diabetes   Diagnosis and orders addressed:  1. Type 2 diabetes mellitus without complication, without long-term current use of insulin ([place]) Continue kombyglize as rx Continue to watch crbs in diet encourgaed o check blod sugars daily RTO in 3 months -  hgba1c   Mary-Margaret Hassell Done, FNP

## 2019-12-03 ENCOUNTER — Other Ambulatory Visit: Payer: Self-pay

## 2019-12-04 ENCOUNTER — Ambulatory Visit (INDEPENDENT_AMBULATORY_CARE_PROVIDER_SITE_OTHER): Payer: BLUE CROSS/BLUE SHIELD

## 2019-12-04 ENCOUNTER — Encounter: Payer: Self-pay | Admitting: Nurse Practitioner

## 2019-12-04 ENCOUNTER — Ambulatory Visit: Payer: BLUE CROSS/BLUE SHIELD | Admitting: Nurse Practitioner

## 2019-12-04 VITALS — BP 135/82 | HR 71 | Temp 98.0°F | Resp 20 | Ht 63.5 in | Wt 175.0 lb

## 2019-12-04 DIAGNOSIS — E782 Mixed hyperlipidemia: Secondary | ICD-10-CM | POA: Diagnosis not present

## 2019-12-04 DIAGNOSIS — I10 Essential (primary) hypertension: Secondary | ICD-10-CM

## 2019-12-04 DIAGNOSIS — Z6829 Body mass index (BMI) 29.0-29.9, adult: Secondary | ICD-10-CM | POA: Diagnosis not present

## 2019-12-04 DIAGNOSIS — E119 Type 2 diabetes mellitus without complications: Secondary | ICD-10-CM | POA: Diagnosis not present

## 2019-12-04 LAB — CMP14+EGFR
ALT: 13 IU/L (ref 0–32)
AST: 15 IU/L (ref 0–40)
Albumin/Globulin Ratio: 1.9 (ref 1.2–2.2)
Albumin: 4.1 g/dL (ref 3.8–4.8)
Alkaline Phosphatase: 98 IU/L (ref 39–117)
BUN/Creatinine Ratio: 18 (ref 12–28)
BUN: 16 mg/dL (ref 8–27)
Bilirubin Total: 0.2 mg/dL (ref 0.0–1.2)
CO2: 22 mmol/L (ref 20–29)
Calcium: 9.1 mg/dL (ref 8.7–10.3)
Chloride: 103 mmol/L (ref 96–106)
Creatinine, Ser: 0.88 mg/dL (ref 0.57–1.00)
GFR calc Af Amer: 79 mL/min/{1.73_m2} (ref >59)
GFR calc non Af Amer: 68 mL/min/{1.73_m2} (ref >59)
Globulin, Total: 2.2 g/dL (ref 1.5–4.5)
Glucose: 168 mg/dL — ABNORMAL HIGH (ref 65–99)
Potassium: 3.9 mmol/L (ref 3.5–5.2)
Sodium: 140 mmol/L (ref 134–144)
Total Protein: 6.3 g/dL (ref 6.0–8.5)

## 2019-12-04 LAB — CBC WITH DIFFERENTIAL/PLATELET
Basophils Absolute: 0 10*3/uL (ref 0.0–0.2)
Basos: 1 %
EOS (ABSOLUTE): 0.4 10*3/uL (ref 0.0–0.4)
Eos: 4 %
Hematocrit: 40.6 % (ref 34.0–46.6)
Hemoglobin: 13.7 g/dL (ref 11.1–15.9)
Immature Grans (Abs): 0 10*3/uL (ref 0.0–0.1)
Immature Granulocytes: 1 %
Lymphocytes Absolute: 2.4 10*3/uL (ref 0.7–3.1)
Lymphs: 28 %
MCH: 27.3 pg (ref 26.6–33.0)
MCHC: 33.7 g/dL (ref 31.5–35.7)
MCV: 81 fL (ref 79–97)
Monocytes Absolute: 0.6 10*3/uL (ref 0.1–0.9)
Monocytes: 7 %
Neutrophils Absolute: 5 10*3/uL (ref 1.4–7.0)
Neutrophils: 59 %
Platelets: 235 10*3/uL (ref 150–450)
RBC: 5.02 x10E6/uL (ref 3.77–5.28)
RDW: 14.5 % (ref 11.7–15.4)
WBC: 8.4 10*3/uL (ref 3.4–10.8)

## 2019-12-04 LAB — LIPID PANEL
Chol/HDL Ratio: 2.7 ratio (ref 0.0–4.4)
Cholesterol, Total: 175 mg/dL (ref 100–199)
HDL: 66 mg/dL (ref >39)
LDL Chol Calc (NIH): 92 mg/dL (ref 0–99)
Triglycerides: 93 mg/dL (ref 0–149)
VLDL Cholesterol Cal: 17 mg/dL (ref 5–40)

## 2019-12-04 LAB — BAYER DCA HB A1C WAIVED: HB A1C (BAYER DCA - WAIVED): 9.4 % — ABNORMAL HIGH (ref <7.0)

## 2019-12-04 MED ORDER — SAXAGLIPTIN-METFORMIN ER 2.5-1000 MG PO TB24: 2.0000 | ORAL_TABLET | Freq: Every day | ORAL | 2 refills | Status: DC

## 2019-12-04 MED ORDER — GLIPIZIDE 5 MG PO TABS: 5.0000 mg | ORAL_TABLET | Freq: Two times a day (BID) | ORAL | 1 refills | Status: DC

## 2019-12-04 MED ORDER — JANUMET 50-1000 MG PO TABS: 1.0000 | ORAL_TABLET | Freq: Two times a day (BID) | ORAL | 2 refills | Status: DC

## 2019-12-04 MED ORDER — OLMESARTAN MEDOXOMIL 40 MG PO TABS: 40.0000 mg | ORAL_TABLET | Freq: Every day | ORAL | 1 refills | Status: DC

## 2019-12-04 MED ORDER — ATORVASTATIN CALCIUM 40 MG PO TABS: 40.0000 mg | ORAL_TABLET | Freq: Every day | ORAL | 1 refills | Status: DC

## 2019-12-04 NOTE — Progress Notes (Signed)
Subjective:    Patient ID: Kara Cortez, female    DOB: December 05, 1950, 69 y.o.   MRN: [id number]   Chief Complaint: Medical Management of Chronic Issues    HPI:  1. Type 2 diabetes mellitus without complication, without long-term current use of insulin (HCC) Does not check blood sugars at home and has not been watching carbs in diet recently. Denies numbness, tingling in feet. Last eye exam in October 2020. Patient wanted to try to do diet control and get down.  Lab Results  Component Value Date   HGBA1C 8.1 (H) 08/25/2019     2. Essential hypertension Does not check BP at home. Denies chest pain, headaches, SOB. Does not watch sodium in diet.  BP Readings from Last 3 Encounters:  12/04/19 135/82  08/25/19 (!) 159/96  07/25/19 134/86     3. Mixed hyperlipidemia Does not watch cholesterol in diet or exercise regularly.  Lab Results  Component Value Date   CHOL 221 (H) 07/25/2019   HDL 63 07/25/2019   LDLCALC 129 (H) 07/25/2019   TRIG 168 (H) 07/25/2019   CHOLHDL 3.5 07/25/2019     4. BMI 29.0-29.9,adult No significant changes in weight.  BMI Readings from Last 3 Encounters:  12/04/19 30.51 kg/m  08/25/19 29.35 kg/m  07/25/19 29.52 kg/m   Wt Readings from Last 3 Encounters:  12/04/19 175 lb (79.4 kg)  08/25/19 171 lb (77.6 kg)  07/25/19 172 lb (78 kg)       Outpatient Encounter Medications as of 12/04/2019  Medication Sig  . atorvastatin (LIPITOR) 40 MG tablet Take 1 tablet (40 mg total) by mouth daily.  Marland Kitchen glipiZIDE (GLUCOTROL) 5 MG tablet Take 1 tablet (5 mg total) by mouth 2 (two) times daily before a meal.  . olmesartan (BENICAR) 40 MG tablet Take 1 tablet (40 mg total) by mouth daily.  Glory Rosebush DELICA LANCETS FINE MISC Test 1 x a day and prn  Dx E11.9  . Saxagliptin-Metformin (KOMBIGLYZE XR) 01-999 MG TB24 Take 1 tablet by mouth daily.    Past Surgical History:  Procedure Laterality Date  . BUNIONECTOMY    . TONSILLECTOMY       Family History  Problem Relation Age of Onset  . Diabetes Mother   . Hypertension Mother   . Heart disease Mother   . Hyperlipidemia Father   . Cancer Father        bladder  . Emphysema Father     New complaints: None  Social history: Lives with husband. No children.  Controlled substance contract: n/a     Review of Systems  Constitutional: Negative.   HENT: Negative.   Eyes: Negative.   Respiratory: Negative.   Cardiovascular: Negative.   Gastrointestinal: Negative.   Endocrine: Negative.   Genitourinary: Negative.   Musculoskeletal: Negative.   Skin: Negative.   Allergic/Immunologic: Negative.   Neurological: Negative.   Hematological: Negative.   Psychiatric/Behavioral: Negative.        Objective:   Physical Exam Vitals and nursing note reviewed.  Constitutional:      Appearance: Normal appearance. She is normal weight.  HENT:     Head: Normocephalic.     Right Ear: Tympanic membrane, ear canal and external ear normal.     Left Ear: Tympanic membrane, ear canal and external ear normal.     Nose: Nose normal.     Mouth/Throat:     Mouth: Mucous membranes are moist.     Pharynx: Oropharynx is clear.  Eyes:  Conjunctiva/sclera: Conjunctivae normal.     Pupils: Pupils are equal, round, and reactive to light.  Cardiovascular:     Rate and Rhythm: Normal rate and regular rhythm.     Pulses: Normal pulses.     Heart sounds: Normal heart sounds.  Pulmonary:     Effort: Pulmonary effort is normal.     Breath sounds: Normal breath sounds.  Abdominal:     General: Bowel sounds are normal.     Palpations: Abdomen is soft.  Musculoskeletal:        General: Normal range of motion.     Cervical back: Normal range of motion and neck supple.  Skin:    General: Skin is warm and dry.     Capillary Refill: Capillary refill takes less than 2 seconds.  Neurological:     General: No focal deficit present.     Mental Status: She is alert and oriented to  person, place, and time.  Psychiatric:        Mood and Affect: Mood normal.        Behavior: Behavior normal.     BP 135/82   Pulse 71   Temp 98 F (36.7 C) (Temporal)   Resp 20   Ht 5' 3.5" (1.613 m)   Wt 175 lb (79.4 kg)   SpO2 98%   BMI 30.51 kg/m   HGBA1c 9.4  EKG- NSR-Mary-Margaret Hassell Done, FNP  Chest xray- no cardiopulmonary abnormalities-Preliminary reading by Ronnald Collum, FNP  [facility]     Assessment & Plan:  Kara Cortez comes in today with chief complaint of Medical Management of Chronic Issues   Diagnosis and orders addressed:  1. Type 2 diabetes mellitus without complication, without long-term current use of insulin (HCC) Count carbs in diet.  Continue to inspect feet at home regularly. Changed to janumet 50/1000 bid  ( insurance would no longer cover kombiglize ) Keep diary of blood sugars - Bayer DCA Hb A1c Waived - Microalbumin / creatinine urine ratio  2. Essential hypertension Low sodium diet. - CBC with Differential/Platelet - CMP14+EGFR  3. Mixed hyperlipidemia Watch cholesterol intake. Weight bearing exercise as tolerated. - Lipid panel  4. BMI 29.0-29.9,adult Discussed diet and exercise for person with BMI >25 Will recheck weight in 3-6 months  Meds ordered this encounter  Medications  . DISCONTD: Saxagliptin-Metformin 2.01-999 MG TB24    Sig: Take 2 tablets by mouth daily.    Dispense:  60 tablet    Refill:  2    Order Specific Question:   Supervising Provider    Answer:   Caryl Pina A [id number]  . glipiZIDE (GLUCOTROL) 5 MG tablet    Sig: Take 1 tablet (5 mg total) by mouth 2 (two) times daily before a meal.    Dispense:  180 tablet    Refill:  1    Order Specific Question:   Supervising Provider    Answer:   Caryl Pina A [id number]  . atorvastatin (LIPITOR) 40 MG tablet    Sig: Take 1 tablet (40 mg total) by mouth daily.    Dispense:  90 tablet    Refill:  1    Order Specific Question:   Supervising Provider     Answer:   Caryl Pina A [id number]  . olmesartan (BENICAR) 40 MG tablet    Sig: Take 1 tablet (40 mg total) by mouth daily.    Dispense:  90 tablet    Refill:  1    Order Specific Question:  Supervising Provider    Answer:   Worthy Rancher [phone]      Labs pending Health Maintenance reviewed Diet and exercise encouraged  Follow up plan: 1 month   [place], FNP

## 2019-12-04 NOTE — Patient Instructions (Signed)
Carbohydrate Counting for Diabetes Mellitus, Adult  Carbohydrate counting is a method of keeping track of how many carbohydrates you eat. Eating carbohydrates naturally increases the amount of sugar (glucose) in the blood. Counting how many carbohydrates you eat helps keep your blood glucose within normal limits, which helps you manage your diabetes (diabetes mellitus). It is important to know how many carbohydrates you can safely have in each meal. This is different for every person. A diet and nutrition specialist (registered dietitian) can help you make a meal plan and calculate how many carbohydrates you should have at each meal and snack. Carbohydrates are found in the following foods:  Grains, such as breads and cereals.  Dried beans and soy products.  Starchy vegetables, such as potatoes, peas, and corn.  Fruit and fruit juices.  Milk and yogurt.  Sweets and snack foods, such as cake, cookies, candy, chips, and soft drinks. How do I count carbohydrates? There are two ways to count carbohydrates in food. You can use either of the methods or a combination of both. Reading "Nutrition Facts" on packaged food The "Nutrition Facts" list is included on the labels of almost all packaged foods and beverages in the [place] It includes:  The serving size.  Information about nutrients in each serving, including the grams (g) of carbohydrate per serving. To use the "Nutrition Facts":  Decide how many servings you will have.  Multiply the number of servings by the number of carbohydrates per serving.  The resulting number is the total amount of carbohydrates that you will be having. Learning standard serving sizes of other foods When you eat carbohydrate foods that are not packaged or do not include "Nutrition Facts" on the label, you need to measure the servings in order to count the amount of carbohydrates:  Measure the foods that you will eat with a food scale or measuring cup, if  needed.  Decide how many standard-size servings you will eat.  Multiply the number of servings by 15. Most carbohydrate-rich foods have about 15 g of carbohydrates per serving. ? For example, if you eat 8 oz (170 g) of strawberries, you will have eaten 2 servings and 30 g of carbohydrates (2 servings x 15 g = 30 g).  For foods that have more than one food mixed, such as soups and casseroles, you must count the carbohydrates in each food that is included. The following list contains standard serving sizes of common carbohydrate-rich foods. Each of these servings has about 15 g of carbohydrates:   hamburger bun or  English muffin.   oz (15 mL) syrup.   oz (14 g) jelly.  1 slice of bread.  1 six-inch tortilla.  3 oz (85 g) cooked rice or pasta.  4 oz (113 g) cooked dried beans.  4 oz (113 g) starchy vegetable, such as peas, corn, or potatoes.  4 oz (113 g) hot cereal.  4 oz (113 g) mashed potatoes or  of a large baked potato.  4 oz (113 g) canned or frozen fruit.  4 oz (120 mL) fruit juice.  4-6 crackers.  6 chicken nuggets.  6 oz (170 g) unsweetened dry cereal.  6 oz (170 g) plain fat-free yogurt or yogurt sweetened with artificial sweeteners.  8 oz (240 mL) milk.  8 oz (170 g) fresh fruit or one small piece of fruit.  24 oz (680 g) popped popcorn. Example of carbohydrate counting Sample meal  3 oz (85 g) chicken breast.  6 oz (170 g)   brown rice.  4 oz (113 g) corn.  8 oz (240 mL) milk.  8 oz (170 g) strawberries with sugar-free whipped topping. Carbohydrate calculation 1. Identify the foods that contain carbohydrates: ? Rice. ? Corn. ? Milk. ? Strawberries. 2. Calculate how many servings you have of each food: ? 2 servings rice. ? 1 serving corn. ? 1 serving milk. ? 1 serving strawberries. 3. Multiply each number of servings by 15 g: ? 2 servings rice x 15 g = 30 g. ? 1 serving corn x 15 g = 15 g. ? 1 serving milk x 15 g = 15 g. ? 1  serving strawberries x 15 g = 15 g. 4. Add together all of the amounts to find the total grams of carbohydrates eaten: ? 30 g + 15 g + 15 g + 15 g = 75 g of carbohydrates total. Summary  Carbohydrate counting is a method of keeping track of how many carbohydrates you eat.  Eating carbohydrates naturally increases the amount of sugar (glucose) in the blood.  Counting how many carbohydrates you eat helps keep your blood glucose within normal limits, which helps you manage your diabetes.  A diet and nutrition specialist (registered dietitian) can help you make a meal plan and calculate how many carbohydrates you should have at each meal and snack. This information is not intended to replace advice given to you by your health care provider. Make sure you discuss any questions you have with your health care provider. Document Revised: 04/12/2017 Document Reviewed: 03/01/2016 Elsevier Patient Education  2020 Elsevier Inc.  

## 2019-12-05 ENCOUNTER — Other Ambulatory Visit: Payer: Self-pay

## 2019-12-05 ENCOUNTER — Other Ambulatory Visit: Payer: BLUE CROSS/BLUE SHIELD

## 2019-12-07 LAB — MICROALBUMIN / CREATININE URINE RATIO
Creatinine, Urine: 62.4 mg/dL
Microalb/Creat Ratio: 21 mg/g creat (ref 0–29)
Microalbumin, Urine: 12.8 ug/mL

## 2020-01-16 ENCOUNTER — Encounter: Payer: Self-pay | Admitting: Nurse Practitioner

## 2020-01-16 ENCOUNTER — Ambulatory Visit: Payer: BLUE CROSS/BLUE SHIELD | Admitting: Nurse Practitioner

## 2020-01-16 ENCOUNTER — Other Ambulatory Visit: Payer: Self-pay

## 2020-01-16 VITALS — BP 145/92 | HR 70 | Temp 98.3°F | Resp 20 | Ht 63.0 in | Wt 172.0 lb

## 2020-01-16 DIAGNOSIS — E119 Type 2 diabetes mellitus without complications: Secondary | ICD-10-CM | POA: Diagnosis not present

## 2020-01-16 LAB — BAYER DCA HB A1C WAIVED: HB A1C (BAYER DCA - WAIVED): 7.5 % — ABNORMAL HIGH (ref <7.0)

## 2020-01-16 NOTE — Progress Notes (Signed)
   Subjective:    Patient ID: Kara Cortez, female    DOB: [birth year], 69 y.o.   MRN: [id number]   Chief Complaint: Recheck diabetes   HPI Patient is here for a follow-up of diabetes. A1C at last visit on 12/04/19 was 9.4 and is 7.5 today. Did not check blood sugars before but is checking them now and they are ranging from 100 to 147. Has been watching her carb intake more closely but some days are better than others. Was having increased heart burn and loose bowel movements after starting the Janumet but that has gotten better this past week. Denies symptoms of hypoglycemia. Has lost about 3 pounds since last visit. Denies numbness, tingling in feet or changes in vision.    Review of Systems  Constitutional: Negative.   HENT: Negative.   Eyes: Negative.   Respiratory: Negative.   Cardiovascular: Negative.   Gastrointestinal: Negative.   Endocrine: Negative.   Genitourinary: Negative.   Musculoskeletal: Negative.   Skin: Negative.   Neurological: Negative.        Objective:   Physical Exam Vitals and nursing note reviewed.  HENT:     Head: Normocephalic.  Cardiovascular:     Rate and Rhythm: Normal rate and regular rhythm.     Heart sounds: Normal heart sounds.  Pulmonary:     Effort: Pulmonary effort is normal.     Breath sounds: Normal breath sounds.  Abdominal:     General: Bowel sounds are normal.  Musculoskeletal:        General: Normal range of motion.     Cervical back: Normal range of motion.  Skin:    General: Skin is warm and dry.     Capillary Refill: Capillary refill takes less than 2 seconds.  Neurological:     Mental Status: She is alert and oriented to person, place, and time.  Psychiatric:        Behavior: Behavior normal.     BP (!) 145/92   Pulse 70   Temp 98.3 F (36.8 C) (Temporal)   Resp 20   Ht 5\' 3"  (1.6 m)   Wt 172 lb (78 kg)   SpO2 97%   BMI 30.47 kg/m      Assessment & Plan:  Kara Cortez comes in today with chief  complaint of Recheck diabetes   Diagnosis and orders addressed:  1. Type 2 diabetes mellitus without complication, without long-term current use of insulin (HCC) Keep watching carbs in diet. Check blood sugar daily. - Bayer DCA Hb A1c Waived   Labs pending Health Maintenance reviewed Diet and exercise encouraged  Follow up plan: 3 months   [place], FNP

## 2020-01-16 NOTE — Patient Instructions (Signed)
Carbohydrate Counting for Diabetes Mellitus, Adult  Carbohydrate counting is a method of keeping track of how many carbohydrates you eat. Eating carbohydrates naturally increases the amount of sugar (glucose) in the blood. Counting how many carbohydrates you eat helps keep your blood glucose within normal limits, which helps you manage your diabetes (diabetes mellitus). It is important to know how many carbohydrates you can safely have in each meal. This is different for every person. A diet and nutrition specialist (registered dietitian) can help you make a meal plan and calculate how many carbohydrates you should have at each meal and snack. Carbohydrates are found in the following foods:  Grains, such as breads and cereals.  Dried beans and soy products.  Starchy vegetables, such as potatoes, peas, and corn.  Fruit and fruit juices.  Milk and yogurt.  Sweets and snack foods, such as cake, cookies, candy, chips, and soft drinks. How do I count carbohydrates? There are two ways to count carbohydrates in food. You can use either of the methods or a combination of both. Reading "Nutrition Facts" on packaged food The "Nutrition Facts" list is included on the labels of almost all packaged foods and beverages in the [place] It includes:  The serving size.  Information about nutrients in each serving, including the grams (g) of carbohydrate per serving. To use the "Nutrition Facts":  Decide how many servings you will have.  Multiply the number of servings by the number of carbohydrates per serving.  The resulting number is the total amount of carbohydrates that you will be having. Learning standard serving sizes of other foods When you eat carbohydrate foods that are not packaged or do not include "Nutrition Facts" on the label, you need to measure the servings in order to count the amount of carbohydrates:  Measure the foods that you will eat with a food scale or measuring cup, if  needed.  Decide how many standard-size servings you will eat.  Multiply the number of servings by 15. Most carbohydrate-rich foods have about 15 g of carbohydrates per serving. ? For example, if you eat 8 oz (170 g) of strawberries, you will have eaten 2 servings and 30 g of carbohydrates (2 servings x 15 g = 30 g).  For foods that have more than one food mixed, such as soups and casseroles, you must count the carbohydrates in each food that is included. The following list contains standard serving sizes of common carbohydrate-rich foods. Each of these servings has about 15 g of carbohydrates:   hamburger bun or  English muffin.   oz (15 mL) syrup.   oz (14 g) jelly.  1 slice of bread.  1 six-inch tortilla.  3 oz (85 g) cooked rice or pasta.  4 oz (113 g) cooked dried beans.  4 oz (113 g) starchy vegetable, such as peas, corn, or potatoes.  4 oz (113 g) hot cereal.  4 oz (113 g) mashed potatoes or  of a large baked potato.  4 oz (113 g) canned or frozen fruit.  4 oz (120 mL) fruit juice.  4-6 crackers.  6 chicken nuggets.  6 oz (170 g) unsweetened dry cereal.  6 oz (170 g) plain fat-free yogurt or yogurt sweetened with artificial sweeteners.  8 oz (240 mL) milk.  8 oz (170 g) fresh fruit or one small piece of fruit.  24 oz (680 g) popped popcorn. Example of carbohydrate counting Sample meal  3 oz (85 g) chicken breast.  6 oz (170 g)   brown rice.  4 oz (113 g) corn.  8 oz (240 mL) milk.  8 oz (170 g) strawberries with sugar-free whipped topping. Carbohydrate calculation 1. Identify the foods that contain carbohydrates: ? Rice. ? Corn. ? Milk. ? Strawberries. 2. Calculate how many servings you have of each food: ? 2 servings rice. ? 1 serving corn. ? 1 serving milk. ? 1 serving strawberries. 3. Multiply each number of servings by 15 g: ? 2 servings rice x 15 g = 30 g. ? 1 serving corn x 15 g = 15 g. ? 1 serving milk x 15 g = 15 g. ? 1  serving strawberries x 15 g = 15 g. 4. Add together all of the amounts to find the total grams of carbohydrates eaten: ? 30 g + 15 g + 15 g + 15 g = 75 g of carbohydrates total. Summary  Carbohydrate counting is a method of keeping track of how many carbohydrates you eat.  Eating carbohydrates naturally increases the amount of sugar (glucose) in the blood.  Counting how many carbohydrates you eat helps keep your blood glucose within normal limits, which helps you manage your diabetes.  A diet and nutrition specialist (registered dietitian) can help you make a meal plan and calculate how many carbohydrates you should have at each meal and snack. This information is not intended to replace advice given to you by your health care provider. Make sure you discuss any questions you have with your health care provider. Document Revised: 04/12/2017 Document Reviewed: 03/01/2016 Elsevier Patient Education  2020 Elsevier Inc.  

## 2020-02-17 ENCOUNTER — Encounter: Payer: Self-pay | Admitting: Dermatology

## 2020-02-17 ENCOUNTER — Other Ambulatory Visit: Payer: Self-pay

## 2020-02-17 ENCOUNTER — Ambulatory Visit: Payer: BLUE CROSS/BLUE SHIELD | Admitting: Dermatology

## 2020-02-17 DIAGNOSIS — L570 Actinic keratosis: Secondary | ICD-10-CM | POA: Diagnosis not present

## 2020-02-17 DIAGNOSIS — D485 Neoplasm of uncertain behavior of skin: Secondary | ICD-10-CM

## 2020-02-17 NOTE — Patient Instructions (Signed)

## 2020-02-21 ENCOUNTER — Encounter: Payer: Self-pay | Admitting: Dermatology

## 2020-02-21 NOTE — Progress Notes (Signed)
   Follow-Up Visit   Subjective  Kara Cortez is a 70 y.o. female who presents for the following: Skin Problem (check over right eyebrow-red patchy spot x 69mths & above left eyebrow - dark spot x months).  Growth Location: Right eyebrow Duration: 6 Plus months Quality:  Associated Signs/Symptoms: Modifying Factors:  Severity:  Timing: Context:   The following portions of the chart were reviewed this encounter and updated as appropriate:     Objective  Well appearing patient in no apparent distress; mood and affect are within normal limits.  All sun exposed areas plus back examined.   Assessment & Plan  AK (actinic keratosis) (2) Left Forehead; Right Forehead  PDTin late fall  Other Related Procedures Photodynamic therapy  Neoplasm of uncertain behavior of skin Right Eyebrow  Skin / nail biopsy Type of biopsy: tangential   Informed consent: discussed and consent obtained   Timeout: patient name, date of birth, surgical site, and procedure verified   Procedure prep:  Patient was prepped and draped in usual sterile fashion Prep type:  Chlorhexidine Anesthesia: the lesion was anesthetized in a standard fashion   Anesthetic:  1% lidocaine w/ epinephrine 1-100,000 local infiltration Instrument used: flexible razor blade   Hemostasis achieved with: ferric subsulfate   Outcome: patient tolerated procedure well   Post-procedure details: wound care instructions given    Specimen 1 - Surgical pathology Differential Diagnosis: bcc vs scc Check Margins: No

## 2020-02-27 ENCOUNTER — Telehealth: Payer: Self-pay | Admitting: *Deleted

## 2020-02-27 NOTE — Telephone Encounter (Signed)
Path to patient  

## 2020-03-15 ENCOUNTER — Other Ambulatory Visit: Payer: Self-pay | Admitting: Nurse Practitioner

## 2020-03-15 DIAGNOSIS — E119 Type 2 diabetes mellitus without complications: Secondary | ICD-10-CM

## 2020-04-07 ENCOUNTER — Other Ambulatory Visit: Payer: Self-pay | Admitting: Nurse Practitioner

## 2020-04-07 DIAGNOSIS — E119 Type 2 diabetes mellitus without complications: Secondary | ICD-10-CM

## 2020-04-19 ENCOUNTER — Ambulatory Visit: Payer: Self-pay | Admitting: Nurse Practitioner

## 2020-04-30 ENCOUNTER — Ambulatory Visit: Payer: BC Managed Care – PPO | Admitting: Nurse Practitioner

## 2020-04-30 ENCOUNTER — Encounter: Payer: Self-pay | Admitting: Nurse Practitioner

## 2020-04-30 ENCOUNTER — Other Ambulatory Visit: Payer: Self-pay

## 2020-04-30 VITALS — BP 130/86 | HR 76 | Temp 97.6°F | Resp 20 | Ht 63.0 in | Wt 176.0 lb

## 2020-04-30 DIAGNOSIS — I10 Essential (primary) hypertension: Secondary | ICD-10-CM

## 2020-04-30 DIAGNOSIS — Z6829 Body mass index (BMI) 29.0-29.9, adult: Secondary | ICD-10-CM

## 2020-04-30 DIAGNOSIS — E782 Mixed hyperlipidemia: Secondary | ICD-10-CM | POA: Diagnosis not present

## 2020-04-30 DIAGNOSIS — E119 Type 2 diabetes mellitus without complications: Secondary | ICD-10-CM

## 2020-04-30 LAB — CMP14+EGFR
ALT: 16 IU/L (ref 0–32)
AST: 15 IU/L (ref 0–40)
Albumin/Globulin Ratio: 1.8 (ref 1.2–2.2)
Albumin: 4.2 g/dL (ref 3.8–4.8)
Alkaline Phosphatase: 99 IU/L (ref 48–121)
BUN/Creatinine Ratio: 19 (ref 12–28)
BUN: 16 mg/dL (ref 8–27)
Bilirubin Total: 0.3 mg/dL (ref 0.0–1.2)
CO2: 23 mmol/L (ref 20–29)
Calcium: 9.7 mg/dL (ref 8.7–10.3)
Chloride: 99 mmol/L (ref 96–106)
Creatinine, Ser: 0.86 mg/dL (ref 0.57–1.00)
GFR calc Af Amer: 80 mL/min/{1.73_m2} (ref >59)
GFR calc non Af Amer: 70 mL/min/{1.73_m2} (ref >59)
Globulin, Total: 2.4 g/dL (ref 1.5–4.5)
Glucose: 164 mg/dL — ABNORMAL HIGH (ref 65–99)
Potassium: 4.5 mmol/L (ref 3.5–5.2)
Sodium: 137 mmol/L (ref 134–144)
Total Protein: 6.6 g/dL (ref 6.0–8.5)

## 2020-04-30 LAB — CBC WITH DIFFERENTIAL/PLATELET
Basophils Absolute: 0.1 10*3/uL (ref 0.0–0.2)
Basos: 1 %
EOS (ABSOLUTE): 0.2 10*3/uL (ref 0.0–0.4)
Eos: 2 %
Hematocrit: 41 % (ref 34.0–46.6)
Hemoglobin: 14 g/dL (ref 11.1–15.9)
Immature Grans (Abs): 0 10*3/uL (ref 0.0–0.1)
Immature Granulocytes: 0 %
Lymphocytes Absolute: 2.4 10*3/uL (ref 0.7–3.1)
Lymphs: 25 %
MCH: 28 pg (ref 26.6–33.0)
MCHC: 34.1 g/dL (ref 31.5–35.7)
MCV: 82 fL (ref 79–97)
Monocytes Absolute: 0.6 10*3/uL (ref 0.1–0.9)
Monocytes: 6 %
Neutrophils Absolute: 6.3 10*3/uL (ref 1.4–7.0)
Neutrophils: 66 %
Platelets: 240 10*3/uL (ref 150–450)
RBC: 5 x10E6/uL (ref 3.77–5.28)
RDW: 14.3 % (ref 11.7–15.4)
WBC: 9.6 10*3/uL (ref 3.4–10.8)

## 2020-04-30 LAB — LIPID PANEL
Chol/HDL Ratio: 2.3 ratio (ref 0.0–4.4)
Cholesterol, Total: 126 mg/dL (ref 100–199)
HDL: 55 mg/dL (ref >39)
LDL Chol Calc (NIH): 50 mg/dL (ref 0–99)
Triglycerides: 115 mg/dL (ref 0–149)
VLDL Cholesterol Cal: 21 mg/dL (ref 5–40)

## 2020-04-30 LAB — BAYER DCA HB A1C WAIVED: HB A1C (BAYER DCA - WAIVED): 9.7 % — ABNORMAL HIGH (ref <7.0)

## 2020-04-30 MED ORDER — GLIPIZIDE 5 MG PO TABS: 5.0000 mg | ORAL_TABLET | Freq: Two times a day (BID) | ORAL | 1 refills | Status: DC

## 2020-04-30 MED ORDER — ATORVASTATIN CALCIUM 40 MG PO TABS: 40.0000 mg | ORAL_TABLET | Freq: Every day | ORAL | 1 refills | Status: DC

## 2020-04-30 MED ORDER — JANUMET 50-1000 MG PO TABS: 1.0000 | ORAL_TABLET | Freq: Two times a day (BID) | ORAL | 1 refills | Status: DC

## 2020-04-30 MED ORDER — OLMESARTAN MEDOXOMIL 40 MG PO TABS: 40.0000 mg | ORAL_TABLET | Freq: Every day | ORAL | 1 refills | Status: DC

## 2020-04-30 NOTE — Patient Instructions (Signed)
Carbohydrate Counting for Diabetes Mellitus, Adult  Carbohydrate counting is a method of keeping track of how many carbohydrates you eat. Eating carbohydrates naturally increases the amount of sugar (glucose) in the blood. Counting how many carbohydrates you eat helps keep your blood glucose within normal limits, which helps you manage your diabetes (diabetes mellitus). It is important to know how many carbohydrates you can safely have in each meal. This is different for every person. A diet and nutrition specialist (registered dietitian) can help you make a meal plan and calculate how many carbohydrates you should have at each meal and snack. Carbohydrates are found in the following foods:  Grains, such as breads and cereals.  Dried beans and soy products.  Starchy vegetables, such as potatoes, peas, and corn.  Fruit and fruit juices.  Milk and yogurt.  Sweets and snack foods, such as cake, cookies, candy, chips, and soft drinks. How do I count carbohydrates? There are two ways to count carbohydrates in food. You can use either of the methods or a combination of both. Reading "Nutrition Facts" on packaged food The "Nutrition Facts" list is included on the labels of almost all packaged foods and beverages in the [place] It includes:  The serving size.  Information about nutrients in each serving, including the grams (g) of carbohydrate per serving. To use the "Nutrition Facts":  Decide how many servings you will have.  Multiply the number of servings by the number of carbohydrates per serving.  The resulting number is the total amount of carbohydrates that you will be having. Learning standard serving sizes of other foods When you eat carbohydrate foods that are not packaged or do not include "Nutrition Facts" on the label, you need to measure the servings in order to count the amount of carbohydrates:  Measure the foods that you will eat with a food scale or measuring cup, if  needed.  Decide how many standard-size servings you will eat.  Multiply the number of servings by 15. Most carbohydrate-rich foods have about 15 g of carbohydrates per serving. ? For example, if you eat 8 oz (170 g) of strawberries, you will have eaten 2 servings and 30 g of carbohydrates (2 servings x 15 g = 30 g).  For foods that have more than one food mixed, such as soups and casseroles, you must count the carbohydrates in each food that is included. The following list contains standard serving sizes of common carbohydrate-rich foods. Each of these servings has about 15 g of carbohydrates:   hamburger bun or  English muffin.   oz (15 mL) syrup.   oz (14 g) jelly.  1 slice of bread.  1 six-inch tortilla.  3 oz (85 g) cooked rice or pasta.  4 oz (113 g) cooked dried beans.  4 oz (113 g) starchy vegetable, such as peas, corn, or potatoes.  4 oz (113 g) hot cereal.  4 oz (113 g) mashed potatoes or  of a large baked potato.  4 oz (113 g) canned or frozen fruit.  4 oz (120 mL) fruit juice.  4-6 crackers.  6 chicken nuggets.  6 oz (170 g) unsweetened dry cereal.  6 oz (170 g) plain fat-free yogurt or yogurt sweetened with artificial sweeteners.  8 oz (240 mL) milk.  8 oz (170 g) fresh fruit or one small piece of fruit.  24 oz (680 g) popped popcorn. Example of carbohydrate counting Sample meal  3 oz (85 g) chicken breast.  6 oz (170 g)   brown rice.  4 oz (113 g) corn.  8 oz (240 mL) milk.  8 oz (170 g) strawberries with sugar-free whipped topping. Carbohydrate calculation 1. Identify the foods that contain carbohydrates: ? Rice. ? Corn. ? Milk. ? Strawberries. 2. Calculate how many servings you have of each food: ? 2 servings rice. ? 1 serving corn. ? 1 serving milk. ? 1 serving strawberries. 3. Multiply each number of servings by 15 g: ? 2 servings rice x 15 g = 30 g. ? 1 serving corn x 15 g = 15 g. ? 1 serving milk x 15 g = 15 g. ? 1  serving strawberries x 15 g = 15 g. 4. Add together all of the amounts to find the total grams of carbohydrates eaten: ? 30 g + 15 g + 15 g + 15 g = 75 g of carbohydrates total. Summary  Carbohydrate counting is a method of keeping track of how many carbohydrates you eat.  Eating carbohydrates naturally increases the amount of sugar (glucose) in the blood.  Counting how many carbohydrates you eat helps keep your blood glucose within normal limits, which helps you manage your diabetes.  A diet and nutrition specialist (registered dietitian) can help you make a meal plan and calculate how many carbohydrates you should have at each meal and snack. This information is not intended to replace advice given to you by your health care provider. Make sure you discuss any questions you have with your health care provider. Document Revised: 04/12/2017 Document Reviewed: 03/01/2016 Elsevier Patient Education  2020 Elsevier Inc.  

## 2020-04-30 NOTE — Progress Notes (Signed)
Subjective:    Patient ID: Kara Cortez, female    DOB: 20-Dec-1950, 69 y.o.   MRN: [id number]   Chief Complaint: Medical Management of Chronic Issues    HPI:  1. Type 2 diabetes mellitus without complication, without long-term current use of insulin ([place]) Patient has checked her CBGs regularly, but has not done that in a few weeks. Reports not following a carb-modified diet.  Lab Results  Component Value Date   HGBA1C 7.5 (H) 01/16/2020     2. Essential hypertension Patient does try to watch salt intake some. Does not check BP at home.  BP Readings from Last 3 Encounters:  01/16/20 (!) 145/92  12/04/19 135/82  08/25/19 (!) 159/96     3. Mixed hyperlipidemia Does not pay attention to fat content of foods, does walk sometimes for exercise.  Lab Results  Component Value Date   CHOL 175 12/04/2019   HDL 66 12/04/2019   LDLCALC 92 12/04/2019   TRIG 93 12/04/2019   CHOLHDL 2.7 12/04/2019   The 10-year ASCVD risk score Mikey Bussing DC Jr., et al., 2013) is: 17.6%   Values used to calculate the score:     Age: 14 years     Sex: Female     Is Non-Hispanic African American: No     Diabetic: Yes     Tobacco smoker: No     Systolic Blood Pressure: 440 mmHg     Is BP treated: Yes     HDL Cholesterol: 66 mg/dL     Total Cholesterol: 175 mg/dL   4. BMI 29.0-29.9,adult Does weigh self at home. Does exercise by walking sometimes. Does not watch fat intake.   Wt Readings from Last 3 Encounters:  04/30/20 176 lb (79.8 kg)  01/16/20 172 lb (78 kg)  12/04/19 175 lb (79.4 kg)   BMI Readings from Last 3 Encounters:  04/30/20 31.18 kg/m  01/16/20 30.47 kg/m  12/04/19 30.51 kg/m       Outpatient Encounter Medications as of 04/30/2020  Medication Sig  . atorvastatin (LIPITOR) 40 MG tablet Take 1 tablet (40 mg total) by mouth daily.  Marland Kitchen glipiZIDE (GLUCOTROL) 5 MG tablet Take 1 tablet (5 mg total) by mouth 2 (two) times daily before a meal.  . JANUMET 50-1000 MG tablet  TAKE 1 TABLET BY MOUTH 2 (TWO) TIMES DAILY WITH A MEAL.  Marland Kitchen olmesartan (BENICAR) 40 MG tablet Take 1 tablet (40 mg total) by mouth daily.  Glory Rosebush DELICA LANCETS FINE MISC Test 1 x a day and prn  Dx E11.9   No facility-administered encounter medications on file as of 04/30/2020.    Past Surgical History:  Procedure Laterality Date  . BUNIONECTOMY    . TONSILLECTOMY      Family History  Problem Relation Age of Onset  . Diabetes Mother   . Hypertension Mother   . Heart disease Mother   . Hyperlipidemia Father   . Cancer Father        bladder  . Emphysema Father     New complaints: No new or worsening complaints.   Social history: Lives at home with her husband and works 40 hours per week.   Controlled substance contract: n/a     Review of Systems  Constitutional: Negative.   HENT: Negative.   Eyes: Negative.   Respiratory: Negative.   Cardiovascular: Negative.   Gastrointestinal: Negative.   Endocrine: Negative.   Genitourinary: Negative.   Musculoskeletal: Negative.   Skin: Negative.   Allergic/Immunologic: Negative.  Neurological: Negative.   Hematological: Negative.   Psychiatric/Behavioral: Negative.        Objective:   Physical Exam Vitals and nursing note reviewed.  Constitutional:      Appearance: Normal appearance. She is obese.  HENT:     Head: Normocephalic and atraumatic.     Right Ear: Tympanic membrane, ear canal and external ear normal.     Left Ear: Tympanic membrane, ear canal and external ear normal.     Nose: Nose normal.     Mouth/Throat:     Mouth: Mucous membranes are moist.     Pharynx: Oropharynx is clear.  Eyes:     Extraocular Movements: Extraocular movements intact.     Conjunctiva/sclera: Conjunctivae normal.     Pupils: Pupils are equal, round, and reactive to light.  Cardiovascular:     Rate and Rhythm: Normal rate and regular rhythm.     Pulses: Normal pulses.     Heart sounds: Normal heart sounds.  Pulmonary:      Effort: Pulmonary effort is normal.     Breath sounds: Normal breath sounds.  Abdominal:     General: Bowel sounds are normal.     Palpations: Abdomen is soft.  Genitourinary:    General: Normal vulva.     Rectum: Normal.  Musculoskeletal:        General: Normal range of motion.     Cervical back: Normal range of motion and neck supple.  Skin:    General: Skin is warm and dry.     Capillary Refill: Capillary refill takes less than 2 seconds.  Neurological:     General: No focal deficit present.     Mental Status: She is alert and oriented to person, place, and time. Mental status is at baseline.  Psychiatric:        Mood and Affect: Mood normal.        Behavior: Behavior normal.        Thought Content: Thought content normal.        Judgment: Judgment normal.     BP (!) 130/86   Pulse 76   Temp 97.6 F (36.4 C) (Temporal)   Resp 20   Ht '5\' 3"'  (1.6 m)   Wt 176 lb (79.8 kg)   SpO2 97%   BMI 31.18 kg/m   hgba1c 9.7    Assessment & Plan:  Kara Cortez comes in today with chief complaint of Medical Management of Chronic Issues   Diagnosis and orders addressed:  1. Type 2 diabetes mellitus without complication, without long-term current use of insulin ([place]) Patient does ot want to change meds - she says she has missed her meds a lot lately and has not been watching diet Patient encouraged to check fasting CBGs daily and to follow a carb-modified diet.  - Bayer DCA Hb A1c Waived  2. Essential hypertension Patient encouraged to reduce dietary salt intake and to check BP regularly at home. Patient encouraged to exercise as tolerated.  - CBC with Differential/Platelet - CMP14+EGFR  3. Mixed hyperlipidemia Patient encouraged to follow a carb-restricted and fat-restricted diet, exercise as tolerated.  - Lipid panel  4. BMI 29.0-29.9,adult Patient encouraged to restrict dietary fat intake and to exercise as tolerated. Instructed on the importance of weighing self  regularly and setting obtainable goals.    Labs pending Health Maintenance reviewed Diet and exercise encouraged  Follow up plan: Follow-up in 3 months    Mary-Margaret Hassell Done, [place], [place]rey Student

## 2020-05-15 ENCOUNTER — Other Ambulatory Visit: Payer: Self-pay | Admitting: Nurse Practitioner

## 2020-05-15 DIAGNOSIS — E119 Type 2 diabetes mellitus without complications: Secondary | ICD-10-CM

## 2020-06-04 ENCOUNTER — Ambulatory Visit: Payer: BC Managed Care – PPO | Admitting: Nurse Practitioner

## 2020-06-04 ENCOUNTER — Other Ambulatory Visit: Payer: Self-pay

## 2020-06-04 ENCOUNTER — Encounter: Payer: Self-pay | Admitting: Nurse Practitioner

## 2020-06-04 VITALS — BP 130/94 | HR 75 | Temp 97.9°F | Resp 20 | Ht 63.0 in | Wt 167.0 lb

## 2020-06-04 DIAGNOSIS — E119 Type 2 diabetes mellitus without complications: Secondary | ICD-10-CM | POA: Diagnosis not present

## 2020-06-04 LAB — BAYER DCA HB A1C WAIVED: HB A1C (BAYER DCA - WAIVED): 8.2 % — ABNORMAL HIGH (ref <7.0)

## 2020-06-04 MED ORDER — GLIPIZIDE 10 MG PO TABS
10.0000 mg | ORAL_TABLET | Freq: Two times a day (BID) | ORAL | 1 refills | Status: DC
Start: 2020-06-04 — End: 2020-09-07

## 2020-06-04 NOTE — Progress Notes (Signed)
Subjective:    Patient ID: Kara Cortez, female    DOB: 02-Sep-1951, 69 y.o.   MRN: [id number]   Chief Complaint: Diabetes   HPI Pt is here today for diabetic follow-up. Last A1C was 9.7 on 04/30/20. Did not make any med changes at last visit per pt request. Pt was missing meds frequently and not watching diet and she was going to watch carbs more closely and take meds as prescribed. A1C today was 8.2. Pt has not been checking blood sugars at home. She states she has greatly reduced her carbohydrate intake and has been taking her medication every day since last visit with no problems. Denies symptoms of hypoglycemia. Denies blurry vision, numbness or tingling in feet. States she does have loose stools occasionally but only about 3 or 4 times and is not bothering her.    Review of Systems  Constitutional: Negative.   HENT: Negative.   Eyes: Negative.   Respiratory: Negative.   Cardiovascular: Negative.   Gastrointestinal: Negative.   Endocrine: Negative.   Genitourinary: Negative.   Musculoskeletal: Negative.   Skin: Negative.   Neurological: Negative.   Psychiatric/Behavioral: Negative.        Objective:   Physical Exam Vitals and nursing note reviewed.  Constitutional:      Appearance: Normal appearance.  HENT:     Head: Normocephalic.     Nose: Nose normal.     Mouth/Throat:     Mouth: Mucous membranes are moist.     Pharynx: Oropharynx is clear.  Cardiovascular:     Rate and Rhythm: Normal rate and regular rhythm.     Pulses: Normal pulses.     Heart sounds: Normal heart sounds.  Pulmonary:     Effort: Pulmonary effort is normal.     Breath sounds: Normal breath sounds.  Musculoskeletal:        General: Normal range of motion.  Skin:    General: Skin is warm and dry.  Neurological:     Mental Status: She is alert and oriented to person, place, and time.     BP (!) 130/94   Pulse 75   Temp 97.9 F (36.6 C) (Temporal)   Resp 20   Ht 5\' 3"  (1.6 m)    Wt 167 lb (75.8 kg)   SpO2 95%   BMI 29.58 kg/m      Assessment & Plan:  Kara Cortez in today with chief complaint of Diabetes   1. Type 2 diabetes mellitus without complication, without long-term current use of insulin (HCC) Increased glipizide from 5 mg bid to 10 mg bid Continue janumet 50-1000 po bid Keep journal of blood sugars Continue strict carb counting - Bayer DCA Hb A1c Waived Meds ordered this encounter  Medications  . glipiZIDE (GLUCOTROL) 10 MG tablet    Sig: Take 1 tablet (10 mg total) by mouth 2 (two) times daily before a meal.    Dispense:  180 tablet    Refill:  1    Order Specific Question:   Supervising Provider    Answer:   Caryl Pina A [[id number]]     The above assessment and management plan was discussed with the patient. The patient verbalized understanding of and has agreed to the management plan. Patient is aware to call the clinic if symptoms persist or worsen. Patient is aware when to return to the clinic for a follow-up visit. Patient educated on when it is appropriate to go to the emergency department.   Mary-Margaret  Hassell Done, [place]

## 2020-06-04 NOTE — Patient Instructions (Signed)
Diabetes Mellitus and Foot Care Foot care is an important part of your health, especially when you have diabetes. Diabetes may cause you to have problems because of poor blood flow (circulation) to your feet and legs, which can cause your skin to:  Become thinner and drier.  Break more easily.  Heal more slowly.  Peel and crack. You may also have nerve damage (neuropathy) in your legs and feet, causing decreased feeling in them. This means that you may not notice minor injuries to your feet that could lead to more serious problems. Noticing and addressing any potential problems early is the best way to prevent future foot problems. How to care for your feet Foot hygiene  Wash your feet daily with warm water and mild soap. Do not use hot water. Then, pat your feet and the areas between your toes until they are completely dry. Do not soak your feet as this can dry your skin.  Trim your toenails straight across. Do not dig under them or around the cuticle. File the edges of your nails with an emery board or nail file.  Apply a moisturizing lotion or petroleum jelly to the skin on your feet and to dry, brittle toenails. Use lotion that does not contain alcohol and is unscented. Do not apply lotion between your toes. Shoes and socks  Wear clean socks or stockings every day. Make sure they are not too tight. Do not wear knee-high stockings since they may decrease blood flow to your legs.  Wear shoes that fit properly and have enough cushioning. Always look in your shoes before you put them on to be sure there are no objects inside.  To break in new shoes, wear them for just a few hours a day. This prevents injuries on your feet. Wounds, scrapes, corns, and calluses  Check your feet daily for blisters, cuts, bruises, sores, and redness. If you cannot see the bottom of your feet, use a mirror or ask someone for help.  Do not cut corns or calluses or try to remove them with medicine.  If you  find a minor scrape, cut, or break in the skin on your feet, keep it and the skin around it clean and dry. You may clean these areas with mild soap and water. Do not clean the area with peroxide, alcohol, or iodine.  If you have a wound, scrape, corn, or callus on your foot, look at it several times a day to make sure it is healing and not infected. Check for: ? Redness, swelling, or pain. ? Fluid or blood. ? Warmth. ? Pus or a bad smell. General instructions  Do not cross your legs. This may decrease blood flow to your feet.  Do not use heating pads or hot water bottles on your feet. They may burn your skin. If you have lost feeling in your feet or legs, you may not know this is happening until it is too late.  Protect your feet from hot and cold by wearing shoes, such as at the beach or on hot pavement.  Schedule a complete foot exam at least once a year (annually) or more often if you have foot problems. If you have foot problems, report any cuts, sores, or bruises to your health care provider immediately. Contact a health care provider if:  You have a medical condition that increases your risk of infection and you have any cuts, sores, or bruises on your feet.  You have an injury that is not   healing.  You have redness on your legs or feet.  You feel burning or tingling in your legs or feet.  You have pain or cramps in your legs and feet.  Your legs or feet are numb.  Your feet always feel cold.  You have pain around a toenail. Get help right away if:  You have a wound, scrape, corn, or callus on your foot and: ? You have pain, swelling, or redness that gets worse. ? You have fluid or blood coming from the wound, scrape, corn, or callus. ? Your wound, scrape, corn, or callus feels warm to the touch. ? You have pus or a bad smell coming from the wound, scrape, corn, or callus. ? You have a fever. ? You have a red line going up your leg. Summary  Check your feet every day  for cuts, sores, red spots, swelling, and blisters.  Moisturize feet and legs daily.  Wear shoes that fit properly and have enough cushioning.  If you have foot problems, report any cuts, sores, or bruises to your health care provider immediately.  Schedule a complete foot exam at least once a year (annually) or more often if you have foot problems. This information is not intended to replace advice given to you by your health care provider. Make sure you discuss any questions you have with your health care provider. Document Revised: 06/11/2019 Document Reviewed: 10/20/2016 Elsevier Patient Education  2020 Elsevier Inc.  

## 2020-08-03 LAB — HM DIABETES EYE EXAM

## 2020-09-07 ENCOUNTER — Other Ambulatory Visit: Payer: BC Managed Care – PPO

## 2020-09-07 ENCOUNTER — Ambulatory Visit: Payer: BC Managed Care – PPO | Admitting: Nurse Practitioner

## 2020-09-07 ENCOUNTER — Other Ambulatory Visit: Payer: Self-pay

## 2020-09-07 ENCOUNTER — Encounter: Payer: Self-pay | Admitting: Nurse Practitioner

## 2020-09-07 VITALS — BP 136/88 | HR 66 | Temp 98.2°F | Resp 20 | Ht 63.0 in | Wt 170.0 lb

## 2020-09-07 DIAGNOSIS — E782 Mixed hyperlipidemia: Secondary | ICD-10-CM | POA: Diagnosis not present

## 2020-09-07 DIAGNOSIS — E119 Type 2 diabetes mellitus without complications: Secondary | ICD-10-CM | POA: Diagnosis not present

## 2020-09-07 DIAGNOSIS — Z6830 Body mass index (BMI) 30.0-30.9, adult: Secondary | ICD-10-CM | POA: Diagnosis not present

## 2020-09-07 DIAGNOSIS — I10 Essential (primary) hypertension: Secondary | ICD-10-CM | POA: Diagnosis not present

## 2020-09-07 LAB — BAYER DCA HB A1C WAIVED: HB A1C (BAYER DCA - WAIVED): 8.3 % — ABNORMAL HIGH (ref <7.0)

## 2020-09-07 MED ORDER — GLIPIZIDE 10 MG PO TABS: 10.0000 mg | ORAL_TABLET | Freq: Two times a day (BID) | ORAL | 1 refills | Status: DC

## 2020-09-07 MED ORDER — ATORVASTATIN CALCIUM 40 MG PO TABS: 40.0000 mg | ORAL_TABLET | Freq: Every day | ORAL | 1 refills | Status: DC

## 2020-09-07 MED ORDER — OLMESARTAN MEDOXOMIL 40 MG PO TABS: 40.0000 mg | ORAL_TABLET | Freq: Every day | ORAL | 1 refills | Status: DC

## 2020-09-07 MED ORDER — JANUMET 50-1000 MG PO TABS: 1.0000 | ORAL_TABLET | Freq: Two times a day (BID) | ORAL | 1 refills | Status: DC

## 2020-09-07 NOTE — Progress Notes (Signed)
Subjective:    Patient ID: Kara Cortez, female    DOB: 1951-03-06, 69 y.o.   MRN: [id number]  Kara Cortez comes in today with chief complaint of  medical management of chronic issues    Diagnosis and orders addressed:  1. Primary hypertension No c/o chest pain, sob or headaches. Does not check her blood pressure at  Home. BP Readings from Last 3 Encounters:  09/07/20 (!) 158/92  06/04/20 (!) 130/94  04/30/20 (!) 130/86     2. Mixed hyperlipidemia Doe snot really watch diet and does no exercise. Lab Results  Component Value Date   CHOL 126 04/30/2020   HDL 55 04/30/2020   LDLCALC 50 04/30/2020   TRIG 115 04/30/2020   CHOLHDL 2.3 04/30/2020     3. Type 2 diabetes mellitus without complication, without long-term current use of insulin (HCC) Fasting blod sugars range from 100-188. She denis any low blood sugars. We increased her glipizide to 84m BID. Lab Results  Component Value Date   HGBA1C 8.2 (H) 06/04/2020     4. BMI 29.0-29.9,adult Weight is up 3lbs Wt Readings from Last 3 Encounters:  09/07/20 170 lb (77.1 kg)  06/04/20 167 lb (75.8 kg)  04/30/20 176 lb (79.8 kg)   BMI Readings from Last 3 Encounters:  09/07/20 30.11 kg/m  06/04/20 29.58 kg/m  04/30/20 31.18 kg/m       Review of Systems     Objective:   Physical Exam Vitals and nursing note reviewed.  Constitutional:      General: She is not in acute distress.    Appearance: Normal appearance. She is well-developed.  HENT:     Head: Normocephalic.     Nose: Nose normal.  Eyes:     Pupils: Pupils are equal, round, and reactive to light.  Neck:     Vascular: No carotid bruit or JVD.  Cardiovascular:     Rate and Rhythm: Normal rate and regular rhythm.     Heart sounds: Normal heart sounds.  Pulmonary:     Effort: Pulmonary effort is normal. No respiratory distress.     Breath sounds: Normal breath sounds. No wheezing or rales.  Chest:     Chest wall: No tenderness.    Abdominal:     General: Bowel sounds are normal. There is no distension or abdominal bruit.     Palpations: Abdomen is soft. There is no hepatomegaly, splenomegaly, mass or pulsatile mass.     Tenderness: There is no abdominal tenderness.  Musculoskeletal:        General: Normal range of motion.     Cervical back: Normal range of motion and neck supple.  Lymphadenopathy:     Cervical: No cervical adenopathy.  Skin:    General: Skin is warm and dry.  Neurological:     Mental Status: She is alert and oriented to person, place, and time.     Deep Tendon Reflexes: Reflexes are normal and symmetric.  Psychiatric:        Behavior: Behavior normal.        Thought Content: Thought content normal.        Judgment: Judgment normal.      HGBA1c 8.3%     Assessment & Plan:  Kara BOWERSOXcomes in today with chief complaint of Medical Management of Chronic Issues   Diagnosis and orders addressed:  1. Primary hypertension Low sodium diet - CBC with Differential/Platelet - CMP14+EGFR - olmesartan (BENICAR) 40 MG tablet; Take 1 tablet (  40 mg total) by mouth daily.  Dispense: 90 tablet; Refill: 1  2. Mixed hyperlipidemia Low fat diet - Lipid panel - atorvastatin (LIPITOR) 40 MG tablet; Take 1 tablet (40 mg total) by mouth daily.  Dispense: 90 tablet; Refill: 1  3. Type 2 diabetes mellitus without complication, without long-term current use of insulin (HCC) Going to see clinical pharmacist to discuss diet - Bayer DCA Hb A1c Waived - sitaGLIPtin-metformin (JANUMET) 50-1000 MG tablet; Take 1 tablet by mouth 2 (two) times daily with a meal.  Dispense: 180 tablet; Refill: 1 - glipiZIDE (GLUCOTROL) 10 MG tablet; Take 1 tablet (10 mg total) by mouth 2 (two) times daily before a meal.  Dispense: 180 tablet; Refill: 1  4. BMI 30.0-30.9,adult Discussed diet and exercise for person with BMI >25 Will recheck weight in 3-6 months    Labs pending Health Maintenance reviewed Diet and  exercise encouraged  Follow up plan: 3 months   Mary-Margaret Hassell Done, FNP

## 2020-09-07 NOTE — Patient Instructions (Signed)
Carbohydrate Counting for Diabetes Mellitus, Adult  Carbohydrate counting is a method of keeping track of how many carbohydrates you eat. Eating carbohydrates naturally increases the amount of sugar (glucose) in the blood. Counting how many carbohydrates you eat helps keep your blood glucose within normal limits, which helps you manage your diabetes (diabetes mellitus). It is important to know how many carbohydrates you can safely have in each meal. This is different for every person. A diet and nutrition specialist (registered dietitian) can help you make a meal plan and calculate how many carbohydrates you should have at each meal and snack. Carbohydrates are found in the following foods:  Grains, such as breads and cereals.  Dried beans and soy products.  Starchy vegetables, such as potatoes, peas, and corn.  Fruit and fruit juices.  Milk and yogurt.  Sweets and snack foods, such as cake, cookies, candy, chips, and soft drinks. How do I count carbohydrates? There are two ways to count carbohydrates in food. You can use either of the methods or a combination of both. Reading "Nutrition Facts" on packaged food The "Nutrition Facts" list is included on the labels of almost all packaged foods and beverages in the [place] It includes:  The serving size.  Information about nutrients in each serving, including the grams (g) of carbohydrate per serving. To use the "Nutrition Facts":  Decide how many servings you will have.  Multiply the number of servings by the number of carbohydrates per serving.  The resulting number is the total amount of carbohydrates that you will be having. Learning standard serving sizes of other foods When you eat carbohydrate foods that are not packaged or do not include "Nutrition Facts" on the label, you need to measure the servings in order to count the amount of carbohydrates:  Measure the foods that you will eat with a food scale or measuring cup, if  needed.  Decide how many standard-size servings you will eat.  Multiply the number of servings by 15. Most carbohydrate-rich foods have about 15 g of carbohydrates per serving. ? For example, if you eat 8 oz (170 g) of strawberries, you will have eaten 2 servings and 30 g of carbohydrates (2 servings x 15 g = 30 g).  For foods that have more than one food mixed, such as soups and casseroles, you must count the carbohydrates in each food that is included. The following list contains standard serving sizes of common carbohydrate-rich foods. Each of these servings has about 15 g of carbohydrates:   hamburger bun or  English muffin.   oz (15 mL) syrup.   oz (14 g) jelly.  1 slice of bread.  1 six-inch tortilla.  3 oz (85 g) cooked rice or pasta.  4 oz (113 g) cooked dried beans.  4 oz (113 g) starchy vegetable, such as peas, corn, or potatoes.  4 oz (113 g) hot cereal.  4 oz (113 g) mashed potatoes or  of a large baked potato.  4 oz (113 g) canned or frozen fruit.  4 oz (120 mL) fruit juice.  4-6 crackers.  6 chicken nuggets.  6 oz (170 g) unsweetened dry cereal.  6 oz (170 g) plain fat-free yogurt or yogurt sweetened with artificial sweeteners.  8 oz (240 mL) milk.  8 oz (170 g) fresh fruit or one small piece of fruit.  24 oz (680 g) popped popcorn. Example of carbohydrate counting Sample meal  3 oz (85 g) chicken breast.  6 oz (170 g)   brown rice.  4 oz (113 g) corn.  8 oz (240 mL) milk.  8 oz (170 g) strawberries with sugar-free whipped topping. Carbohydrate calculation 1. Identify the foods that contain carbohydrates: ? Rice. ? Corn. ? Milk. ? Strawberries. 2. Calculate how many servings you have of each food: ? 2 servings rice. ? 1 serving corn. ? 1 serving milk. ? 1 serving strawberries. 3. Multiply each number of servings by 15 g: ? 2 servings rice x 15 g = 30 g. ? 1 serving corn x 15 g = 15 g. ? 1 serving milk x 15 g = 15 g. ? 1  serving strawberries x 15 g = 15 g. 4. Add together all of the amounts to find the total grams of carbohydrates eaten: ? 30 g + 15 g + 15 g + 15 g = 75 g of carbohydrates total. Summary  Carbohydrate counting is a method of keeping track of how many carbohydrates you eat.  Eating carbohydrates naturally increases the amount of sugar (glucose) in the blood.  Counting how many carbohydrates you eat helps keep your blood glucose within normal limits, which helps you manage your diabetes.  A diet and nutrition specialist (registered dietitian) can help you make a meal plan and calculate how many carbohydrates you should have at each meal and snack. This information is not intended to replace advice given to you by your health care provider. Make sure you discuss any questions you have with your health care provider. Document Revised: 04/12/2017 Document Reviewed: 03/01/2016 Elsevier Patient Education  2020 Elsevier Inc.  

## 2020-09-08 LAB — CMP14+EGFR
ALT: 16 IU/L (ref 0–32)
AST: 14 IU/L (ref 0–40)
Albumin/Globulin Ratio: 2.1 (ref 1.2–2.2)
Albumin: 4.6 g/dL (ref 3.8–4.8)
Alkaline Phosphatase: 95 IU/L (ref 44–121)
BUN/Creatinine Ratio: 18 (ref 12–28)
BUN: 17 mg/dL (ref 8–27)
Bilirubin Total: 0.4 mg/dL (ref 0.0–1.2)
CO2: 24 mmol/L (ref 20–29)
Calcium: 9.4 mg/dL (ref 8.7–10.3)
Chloride: 101 mmol/L (ref 96–106)
Creatinine, Ser: 0.96 mg/dL (ref 0.57–1.00)
GFR calc Af Amer: 70 mL/min/{1.73_m2} (ref >59)
GFR calc non Af Amer: 61 mL/min/{1.73_m2} (ref >59)
Globulin, Total: 2.2 g/dL (ref 1.5–4.5)
Glucose: 112 mg/dL — ABNORMAL HIGH (ref 65–99)
Potassium: 4.2 mmol/L (ref 3.5–5.2)
Sodium: 141 mmol/L (ref 134–144)
Total Protein: 6.8 g/dL (ref 6.0–8.5)

## 2020-09-08 LAB — CBC WITH DIFFERENTIAL/PLATELET
Basophils Absolute: 0 10*3/uL (ref 0.0–0.2)
Basos: 0 %
EOS (ABSOLUTE): 0.3 10*3/uL (ref 0.0–0.4)
Eos: 3 %
Hematocrit: 42.2 % (ref 34.0–46.6)
Hemoglobin: 14 g/dL (ref 11.1–15.9)
Immature Grans (Abs): 0 10*3/uL (ref 0.0–0.1)
Immature Granulocytes: 0 %
Lymphocytes Absolute: 2.8 10*3/uL (ref 0.7–3.1)
Lymphs: 29 %
MCH: 27.3 pg (ref 26.6–33.0)
MCHC: 33.2 g/dL (ref 31.5–35.7)
MCV: 82 fL (ref 79–97)
Monocytes Absolute: 0.5 10*3/uL (ref 0.1–0.9)
Monocytes: 5 %
Neutrophils Absolute: 5.8 10*3/uL (ref 1.4–7.0)
Neutrophils: 63 %
Platelets: 250 10*3/uL (ref 150–450)
RBC: 5.13 x10E6/uL (ref 3.77–5.28)
RDW: 13.6 % (ref 11.7–15.4)
WBC: 9.4 10*3/uL (ref 3.4–10.8)

## 2020-09-08 LAB — LIPID PANEL
Chol/HDL Ratio: 2.5 ratio (ref 0.0–4.4)
Cholesterol, Total: 149 mg/dL (ref 100–199)
HDL: 59 mg/dL (ref >39)
LDL Chol Calc (NIH): 72 mg/dL (ref 0–99)
Triglycerides: 99 mg/dL (ref 0–149)
VLDL Cholesterol Cal: 18 mg/dL (ref 5–40)

## 2020-09-10 ENCOUNTER — Ambulatory Visit: Payer: BC Managed Care – PPO | Admitting: Pharmacist

## 2020-09-10 ENCOUNTER — Other Ambulatory Visit: Payer: Self-pay

## 2020-09-10 DIAGNOSIS — E119 Type 2 diabetes mellitus without complications: Secondary | ICD-10-CM | POA: Diagnosis not present

## 2020-09-10 NOTE — Progress Notes (Signed)
    09/10/2020 Name: Kara Cortez MRN: [id number] DOB: 1951/03/04   S:  27 yoF Presents for diabetes evaluation, education, and management Patient was referred and last seen by Primary Care Provider on 09/07/20. Patient reports Diabetes was diagnosed in 3-62yrs  Insurance coverage/medication affordability: BCBS  Patient reports adherence with medications. . Current diabetes medications include: janumet, glipizide . Current hypertension medications include: olmesartan Goal 130/80 . Current hyperlipidemia medications include: atorvastatin   Patient denies hypoglycemic events.   Patient reported dietary habits: Eats 2-3 meals/day  Discussed meal planning options and Plate method for healthy eating . Avoid sugary drinks and desserts . Incorporate balanced protein, non starchy veggies, 1 serving of carbohydrate with each meal . Increase water intake . Increase physical activity as able  Patient-reported exercise habits: n/a--encouraged   O:  Lab Results  Component Value Date   HGBA1C 8.3 (H) 09/07/2020     Lipid Panel     Component Value Date/Time   CHOL 149 09/07/2020 1110   TRIG 99 09/07/2020 1110   TRIG 116 11/30/2014 1051   HDL 59 09/07/2020 1110   HDL 69 11/30/2014 1051   CHOLHDL 2.5 09/07/2020 1110   LDLCALC 72 09/07/2020 1110   LDLCALC 95 07/23/2013 1249    Home fasting blood sugars: 102-188  2 hour post-meal/random blood sugars: n/a.   A/P:  Diabetes T2DM currently uncontrolled. Patient is adherent with medication. Control is suboptimal due to diet/lifestyle. disucssed meals   -Patient does not wish to add medication at this time.  She would like to try lifestyle modifications. Reviewed and discussed diabetes --> REVIEWED HEALTHY PLATE  METHOD, materials given  -Continue Janumet and glipizide  copay card for janumet  XT#[id number]  KPV#[id number]  BIN: [id number]  PCN: [id number]  -Consider SGLT2 and discontinue glipizide  -Extensively discussed  pathophysiology of diabetes, recommended lifestyle interventions, dietary effects on blood sugar control  -Counseled on s/sx of and management of hypoglycemia  -Next A1C anticipated 11/2020.  Written patient instructions provided.  Total time in face to face counseling 30 minutes.   Follow up [facility] Visit ON 11/2020  Regina Eck, PharmD, [place] Clinical Pharmacist, [place]  II Phone [phone]

## 2020-09-16 ENCOUNTER — Telehealth: Payer: Self-pay | Admitting: Pharmacist

## 2020-09-16 NOTE — Telephone Encounter (Signed)
Called [employer] to [employer] copay card   Please let patient know copay card is up front in an envelop when she is ready to pick up.  Medication should be $5

## 2020-09-16 NOTE — Telephone Encounter (Signed)
Pt said she would pick up copay card after Christmas.

## 2020-09-16 NOTE — Telephone Encounter (Signed)
Lmtcb.

## 2020-11-15 ENCOUNTER — Ambulatory Visit: Payer: BC Managed Care – PPO | Admitting: Dermatology

## 2020-11-15 ENCOUNTER — Other Ambulatory Visit: Payer: Self-pay

## 2020-11-15 DIAGNOSIS — L570 Actinic keratosis: Secondary | ICD-10-CM | POA: Diagnosis not present

## 2020-11-15 MED ORDER — AMINOLEVULINIC ACID HCL 10 % EX GEL
2000.0000 mg | Freq: Once | CUTANEOUS | Status: AC
  Administered 2020-11-15: 2000 mg via TOPICAL

## 2020-11-15 NOTE — Patient Instructions (Signed)

## 2020-11-15 NOTE — Progress Notes (Signed)
Photodynamic Therapy Procedure Note Diagnosis: actinic keratosis Location: face Informed Consent: Discussed risks (burning, pain, redness, peeling, severe sunburn-like reaction, blistering, discoloration, lack of resolution) and benefits of the procedure, as well as the alternatives. Informed consent was obtained. Preparation: After cleansing the skin, the area to be treated was coated with Levulan.  This was allowed to sit on the skin for 1.5 hours. Procedure Details: The patient was placed under the light source with appropriate eye protection for 16 minutes and 42 seconds.  After completing the treatment, the patient applied sunscreen to the treated areas. Patient tolerated the procedure well Plan: Avoid any sun exposure for the next 24 hours. Wear sunscreen daily for the next week. Observe normal sun precautions thereafter. Recommend OTC analgesia as needed for pain. Follow-up in 10 weeks.

## 2020-11-26 ENCOUNTER — Encounter: Payer: Self-pay | Admitting: Dermatology

## 2020-11-27 NOTE — Progress Notes (Signed)
I, Lavonna Monarch, MD, have reviewed all documentation for this visit. The documentation on 11/27/20 for the exam, diagnosis, procedures, and orders are all accurate and complete.

## 2020-12-06 ENCOUNTER — Encounter: Payer: Self-pay | Admitting: Nurse Practitioner

## 2020-12-06 ENCOUNTER — Ambulatory Visit: Payer: BC Managed Care – PPO | Admitting: Nurse Practitioner

## 2020-12-06 ENCOUNTER — Other Ambulatory Visit: Payer: Self-pay

## 2020-12-06 VITALS — BP 139/84 | HR 74 | Temp 98.1°F | Resp 20 | Ht 63.0 in | Wt 171.0 lb

## 2020-12-06 DIAGNOSIS — Z23 Encounter for immunization: Secondary | ICD-10-CM

## 2020-12-06 DIAGNOSIS — E119 Type 2 diabetes mellitus without complications: Secondary | ICD-10-CM | POA: Diagnosis not present

## 2020-12-06 DIAGNOSIS — E782 Mixed hyperlipidemia: Secondary | ICD-10-CM

## 2020-12-06 DIAGNOSIS — Z6829 Body mass index (BMI) 29.0-29.9, adult: Secondary | ICD-10-CM

## 2020-12-06 DIAGNOSIS — I10 Essential (primary) hypertension: Secondary | ICD-10-CM

## 2020-12-06 LAB — BAYER DCA HB A1C WAIVED: HB A1C (BAYER DCA - WAIVED): 9.2 % — ABNORMAL HIGH (ref <7.0)

## 2020-12-06 MED ORDER — GLIPIZIDE 10 MG PO TABS: ORAL_TABLET | ORAL | 1 refills | Status: DC

## 2020-12-06 NOTE — Progress Notes (Signed)
Subjective:    Patient ID: Kara Cortez, female    DOB: 1951-06-10, 70 y.o.   MRN: [id number]   Chief Complaint: Medical Management of Chronic Issues    HPI:  1. Type 2 diabetes mellitus without complication, without long-term current use of insulin (HCC) Has not been eating well lately. She is getting ready to retire and has not been watching her diet lately. Increased stress. Has not been checking FBG lately. No issues with low blood sugars. Taking janumet and glipizide.  Occasional diarrhea, but manageable.   Lab Results  Component Value Date   HGBA1C 8.3 (H) 09/07/2020    2. Primary hypertension Checks BP periodically at home and is "ok." Tolerating olmasartan well, no issues with hypotension.  BP Readings from Last 3 Encounters:  12/06/20 139/84  09/07/20 136/88  06/04/20 (!) 130/94    3. Mixed hyperlipidemia On atorvastatin, no reported side effects.   Lab Results  Component Value Date   CHOL 149 09/07/2020   HDL 59 09/07/2020   LDLCALC 72 09/07/2020   TRIG 99 09/07/2020   CHOLHDL 2.5 09/07/2020    4. BMI 29.0-29.9,adult Weight stable. Is not watching diet.   Wt Readings from Last 3 Encounters:  12/06/20 171 lb (77.6 kg)  09/07/20 170 lb (77.1 kg)  06/04/20 167 lb (75.8 kg)       Outpatient Encounter Medications as of 12/06/2020  Medication Sig  . atorvastatin (LIPITOR) 40 MG tablet Take 1 tablet (40 mg total) by mouth daily.  . Continuous Blood Gluc Sensor (FREESTYLE LIBRE 14 DAY SENSOR) MISC USE AS DIRECTED DAILY FOR 14 DAYS. CHANGE DEVICE EVERY 14 DAYS. (Patient not taking: Reported on 09/07/2020)  . glipiZIDE (GLUCOTROL) 10 MG tablet Take 1 tablet (10 mg total) by mouth 2 (two) times daily before a meal.  . olmesartan (BENICAR) 40 MG tablet Take 1 tablet (40 mg total) by mouth daily.  Glory Rosebush DELICA LANCETS FINE MISC Test 1 x a day and prn  Dx E11.9  . sitaGLIPtin-metformin (JANUMET) 50-1000 MG tablet Take 1 tablet by mouth 2 (two) times  daily with a meal.   No facility-administered encounter medications on file as of 12/06/2020.    Past Surgical History:  Procedure Laterality Date  . BUNIONECTOMY    . TONSILLECTOMY      Family History  Problem Relation Age of Onset  . Diabetes Mother   . Hypertension Mother   . Heart disease Mother   . Hyperlipidemia Father   . Cancer Father        bladder  . Emphysema Father     New complaints: Would like her shingles vaccine today.   Social history: Retiring soon. Lives with husband.   Controlled substance contract: N/A     Review of Systems  Constitutional: Negative for chills, diaphoresis, fatigue and fever.  Respiratory: Negative for cough, shortness of breath and wheezing.   Cardiovascular: Negative for chest pain and palpitations.  Gastrointestinal: Negative for abdominal distention, abdominal pain, constipation and diarrhea.  Genitourinary: Negative for difficulty urinating, dysuria and hematuria.  Musculoskeletal: Negative for arthralgias and myalgias.  Neurological: Negative for dizziness, syncope and headaches.  Psychiatric/Behavioral: Negative for confusion. The patient is not nervous/anxious.        Objective:   Physical Exam Constitutional:      Appearance: She is obese.  Neck:     Vascular: No carotid bruit.  Cardiovascular:     Rate and Rhythm: Normal rate and regular rhythm.  Pulses: Normal pulses.     Heart sounds: Normal heart sounds.  Pulmonary:     Effort: Pulmonary effort is normal.     Breath sounds: Normal breath sounds.  Abdominal:     General: Bowel sounds are normal.     Palpations: Abdomen is soft.  Musculoskeletal:        General: Normal range of motion.     Cervical back: Normal range of motion and neck supple.  Skin:    General: Skin is warm and dry.     Capillary Refill: Capillary refill takes less than 2 seconds.  Neurological:     Mental Status: She is alert and oriented to person, place, and time.  Psychiatric:         Mood and Affect: Mood normal.        Behavior: Behavior normal.        Thought Content: Thought content normal.        Judgment: Judgment normal.    Vitals:   12/06/20 1039  BP: 139/84  Pulse: 74  Resp: 20  Temp: 98.1 F (36.7 C)  SpO2: 95%     Today's A1c 9.2%  Assessment & Plan:   Kara Cortez comes in today with chief complaint of Medical Management of Chronic Issues   Diagnosis and orders addressed:  1. Type 2 diabetes mellitus without complication, without long-term current use of insulin (HCC) Increase glipizide to 49m AM and 143mPM. She will begin a low carb diet and reassess at her next visit. She was educated on the importance of good glucose control and long term repercussions of high blood pressures.  - Bayer DCA Hb A1c Waived - Microalbumin / creatinine urine ratio  2. Primary hypertension Continue medications as prescribed. Monitor BP's at home, exercise daily.  - CBC with Differential/Platelet - CMP14+EGFR  3. Mixed hyperlipidemia Continue medications as prescribed.  - Lipid panel  4. BMI 29.0-29.9,adult Weight stable. Daily exercise and low carb diet encouraged.    Labs pending Health Maintenance reviewed Diet and exercise encouraged  Follow up plan: 3 months  LoDollene PrimroseRN, BSN, FNP-Student  Mary-Margaret MaHassell DoneFNP

## 2020-12-06 NOTE — Patient Instructions (Signed)

## 2020-12-06 NOTE — Addendum Note (Signed)
Addended by: Rolena Infante on: 12/06/2020 02:19 PM   Modules accepted: Orders

## 2020-12-07 LAB — CBC WITH DIFFERENTIAL/PLATELET
Basophils Absolute: 0.1 10*3/uL (ref 0.0–0.2)
Basos: 1 %
EOS (ABSOLUTE): 0.3 10*3/uL (ref 0.0–0.4)
Eos: 4 %
Hematocrit: 41.7 % (ref 34.0–46.6)
Hemoglobin: 13.9 g/dL (ref 11.1–15.9)
Immature Grans (Abs): 0 10*3/uL (ref 0.0–0.1)
Immature Granulocytes: 0 %
Lymphocytes Absolute: 2.3 10*3/uL (ref 0.7–3.1)
Lymphs: 30 %
MCH: 27 pg (ref 26.6–33.0)
MCHC: 33.3 g/dL (ref 31.5–35.7)
MCV: 81 fL (ref 79–97)
Monocytes Absolute: 0.4 10*3/uL (ref 0.1–0.9)
Monocytes: 6 %
Neutrophils Absolute: 4.5 10*3/uL (ref 1.4–7.0)
Neutrophils: 59 %
Platelets: 238 10*3/uL (ref 150–450)
RBC: 5.14 x10E6/uL (ref 3.77–5.28)
RDW: 13.4 % (ref 11.7–15.4)
WBC: 7.6 10*3/uL (ref 3.4–10.8)

## 2020-12-07 LAB — CMP14+EGFR
ALT: 14 IU/L (ref 0–32)
AST: 15 IU/L (ref 0–40)
Albumin/Globulin Ratio: 2 (ref 1.2–2.2)
Albumin: 4.5 g/dL (ref 3.8–4.8)
Alkaline Phosphatase: 85 IU/L (ref 44–121)
BUN/Creatinine Ratio: 16 (ref 12–28)
BUN: 14 mg/dL (ref 8–27)
Bilirubin Total: 0.3 mg/dL (ref 0.0–1.2)
CO2: 23 mmol/L (ref 20–29)
Calcium: 9.3 mg/dL (ref 8.7–10.3)
Chloride: 100 mmol/L (ref 96–106)
Creatinine, Ser: 0.86 mg/dL (ref 0.57–1.00)
Globulin, Total: 2.2 g/dL (ref 1.5–4.5)
Glucose: 131 mg/dL — ABNORMAL HIGH (ref 65–99)
Potassium: 4 mmol/L (ref 3.5–5.2)
Sodium: 138 mmol/L (ref 134–144)
Total Protein: 6.7 g/dL (ref 6.0–8.5)
eGFR: 74 mL/min/{1.73_m2} (ref >59)

## 2020-12-07 LAB — LIPID PANEL
Chol/HDL Ratio: 2.6 ratio (ref 0.0–4.4)
Cholesterol, Total: 157 mg/dL (ref 100–199)
HDL: 61 mg/dL (ref >39)
LDL Chol Calc (NIH): 78 mg/dL (ref 0–99)
Triglycerides: 102 mg/dL (ref 0–149)
VLDL Cholesterol Cal: 18 mg/dL (ref 5–40)

## 2020-12-07 LAB — MICROALBUMIN / CREATININE URINE RATIO
Creatinine, Urine: 34.7 mg/dL
Microalb/Creat Ratio: 24 mg/g creat (ref 0–29)
Microalbumin, Urine: 8.4 ug/mL

## 2021-01-31 ENCOUNTER — Other Ambulatory Visit: Payer: Self-pay

## 2021-01-31 ENCOUNTER — Encounter: Payer: Self-pay | Admitting: Dermatology

## 2021-01-31 ENCOUNTER — Ambulatory Visit (INDEPENDENT_AMBULATORY_CARE_PROVIDER_SITE_OTHER): Payer: Self-pay | Admitting: Dermatology

## 2021-01-31 DIAGNOSIS — L570 Actinic keratosis: Secondary | ICD-10-CM

## 2021-01-31 DIAGNOSIS — L821 Other seborrheic keratosis: Secondary | ICD-10-CM

## 2021-02-09 ENCOUNTER — Encounter: Payer: Self-pay | Admitting: Dermatology

## 2021-02-09 NOTE — Progress Notes (Signed)
   Follow-Up Visit   Subjective  Kara Cortez is a 70 y.o. female who presents for the following: Follow-up (PDT- follow up on face - had a good reaction).  PDT follow-up, still some rough spots Location:  Duration:  Quality:  Associated Signs/Symptoms: Modifying Factors:  Severity:  Timing: Context:   Objective  Well appearing patient in no apparent distress; mood and affect are within normal limits. Objective  Right Eyebrow: Overall 70% improvement after PDT.  No uncovered skin cancers.  1 persistent thick actinic keratosis will be treated with LN2 freeze  Objective  Left Buccal Cheek, Right Buccal Cheek: Textured tan flattopped 4 mm papules    A focused examination was performed including Head and neck.. Relevant physical exam findings are noted in the Assessment and Plan.   Assessment & Plan    AK (actinic keratosis) Right Eyebrow  PDT follow up.  Destruction of lesion - Right Eyebrow Complexity: simple   Destruction method: cryotherapy   Informed consent: discussed and consent obtained   Timeout:  patient name, date of birth, surgical site, and procedure verified Lesion destroyed using liquid nitrogen: Yes   Cryotherapy cycles:  3 Outcome: patient tolerated procedure well with no complications   Post-procedure details: wound care instructions given    Seborrheic keratosis (2) Left Buccal Cheek; Right Buccal Cheek  Benign no treatment needed if stable.      I, Lavonna Monarch, MD, have reviewed all documentation for this visit.  The documentation on 02/09/21 for the exam, diagnosis, procedures, and orders are all accurate and complete.

## 2021-02-16 ENCOUNTER — Telehealth: Payer: Self-pay

## 2021-02-16 NOTE — Telephone Encounter (Signed)
Called in regards to scheduling mammogram - no answer and voice mail full

## 2021-03-15 ENCOUNTER — Ambulatory Visit: Payer: Self-pay | Admitting: Nurse Practitioner

## 2021-04-18 ENCOUNTER — Ambulatory Visit: Payer: Self-pay | Admitting: Nurse Practitioner

## 2021-06-07 ENCOUNTER — Ambulatory Visit: Payer: Self-pay | Admitting: Nurse Practitioner

## 2021-06-14 ENCOUNTER — Ambulatory Visit (INDEPENDENT_AMBULATORY_CARE_PROVIDER_SITE_OTHER): Payer: Self-pay | Admitting: Family Medicine

## 2021-06-14 ENCOUNTER — Encounter: Payer: Self-pay | Admitting: Family Medicine

## 2021-06-14 DIAGNOSIS — U071 COVID-19: Secondary | ICD-10-CM

## 2021-06-14 NOTE — Progress Notes (Signed)
Virtual Visit via telephone Note Due to COVID-19 pandemic this visit was conducted virtually. This visit type was conducted due to national recommendations for restrictions regarding the COVID-19 Pandemic (e.g. social distancing, sheltering in place) in an effort to limit this patient's exposure and mitigate transmission in our community. All issues noted in this document were discussed and addressed.  A physical exam was not performed with this format.   I connected with Kara Cortez on 06/14/2021 at 1205 by telephone and verified that I am speaking with the correct person using two identifiers. Kara Cortez is currently located at home and  no one  is currently with them during visit. The provider, Monia Pouch, FNP is located in their office at time of visit.  I discussed the limitations, risks, security and privacy concerns of performing an evaluation and management service by telephone and the availability of in person appointments. I also discussed with the patient that there may be a patient responsible charge related to this service. The patient expressed understanding and agreed to proceed.  Subjective:  Patient ID: Kara Cortez, female    DOB: [birth year], 70 y.o.   MRN: [id number]  Chief Complaint:  Covid Positive   HPI: Kara Cortez is a 70 y.o. female presenting on 06/14/2021 for Covid Positive   Pt states on 06/04/2021 she developed cough, congestion, and headache. She tested positive for COVID-19 on 06/06/2021 and 06/08/2021. She states her symptoms have since resolved but she is concerned about proper quarantine guidelines as she is scheduled to go on a trip to the beach.     Relevant past medical, surgical, family, and social history reviewed and updated as indicated.  Allergies and medications reviewed and updated.   Past Medical History:  Diagnosis Date   Diabetes mellitus without complication ([place])    Hypertension     Past Surgical  History:  Procedure Laterality Date   BUNIONECTOMY     TONSILLECTOMY      Social History   Socioeconomic History   Marital status: Married    Spouse name: Not on file   Number of children: Not on file   Years of education: Not on file   Highest education level: Not on file  Occupational History   Not on file  Tobacco Use   Smoking status: Never   Smokeless tobacco: Never  Substance and Sexual Activity   Alcohol use: Yes    Comment: occasional   Drug use: No   Sexual activity: Not on file  Other Topics Concern   Not on file  Social History Narrative   Not on file   Social Determinants of Health   Financial Resource Strain: Not on file  Food Insecurity: Not on file  Transportation Needs: Not on file  Physical Activity: Not on file  Stress: Not on file  Social Connections: Not on file  Intimate Partner Violence: Not on file    Outpatient Encounter Medications as of 06/14/2021  Medication Sig   atorvastatin (LIPITOR) 40 MG tablet Take 1 tablet (40 mg total) by mouth daily.   Continuous Blood Gluc Sensor (FREESTYLE LIBRE 14 DAY SENSOR) MISC USE AS DIRECTED DAILY FOR 14 DAYS. CHANGE DEVICE EVERY 14 DAYS.   glipiZIDE (GLUCOTROL) 10 MG tablet Take 2 po q am and 1 po qhs   olmesartan (BENICAR) 40 MG tablet Take 1 tablet (40 mg total) by mouth daily.   ONETOUCH DELICA LANCETS FINE MISC Test 1 x a day and prn  Dx  E11.9   sitaGLIPtin-metformin (JANUMET) 50-1000 MG tablet Take 1 tablet by mouth 2 (two) times daily with a meal.   No facility-administered encounter medications on file as of 06/14/2021.    Allergies  Allergen Reactions   Amoxicillin Rash    Review of Systems  Constitutional:  Negative for activity change, appetite change, chills, fatigue and fever.  HENT: Negative.  Negative for congestion, postnasal drip, rhinorrhea, sinus pressure and sinus pain.   Eyes: Negative.   Respiratory:  Negative for cough, chest tightness and shortness of breath.    Cardiovascular:  Negative for chest pain, palpitations and leg swelling.  Gastrointestinal:  Negative for blood in stool, constipation, diarrhea, nausea and vomiting.  Endocrine: Negative.   Genitourinary:  Negative for dysuria, frequency and urgency.  Musculoskeletal:  Negative for arthralgias and myalgias.  Skin: Negative.   Allergic/Immunologic: Negative.   Neurological:  Negative for dizziness, tremors, seizures, syncope, facial asymmetry, speech difficulty, weakness, light-headedness, numbness and headaches.  Hematological: Negative.   Psychiatric/Behavioral:  Negative for confusion, hallucinations, sleep disturbance and suicidal ideas.   All other systems reviewed and are negative.       Observations/Objective: No vital signs or physical exam, this was a telephone or virtual health encounter.  Pt alert and oriented, answers all questions appropriately, and able to speak in full sentences.    Assessment and Plan: Kara Cortez was seen today for covid positive.  Diagnoses and all orders for this visit:  COVID-19 virus infection Pt developed symptoms of COVID-19 06/04/2021 and tested positive at home on 06/06/2021. Discussed current CDC guidelines for quarantine and mask use. She should wear a mask until 06/16/2021 but can leave home as quarantine time ended 06/12/2021. Pt aware to visit CDC website for detailed information on current guidelines.     Follow Up Instructions: Return if symptoms worsen or fail to improve.    I discussed the assessment and treatment plan with the patient. The patient was provided an opportunity to ask questions and all were answered. The patient agreed with the plan and demonstrated an understanding of the instructions.   The patient was advised to call back or seek an in-person evaluation if the symptoms worsen or if the condition fails to improve as anticipated.  The above assessment and management plan was discussed with the patient. The  patient verbalized understanding of and has agreed to the management plan. Patient is aware to call the clinic if they develop any new symptoms or if symptoms persist or worsen. Patient is aware when to return to the clinic for a follow-up visit. Patient educated on when it is appropriate to go to the emergency department.    I provided 11 minutes of non-face-to-face time during this encounter. The call started at 1205. The call ended at 1214. The other time was used for coordination of care.    Monia Pouch, FNP-C [facility] [address] [place], [place] [address] [phone] 06/14/2021

## 2021-08-03 ENCOUNTER — Ambulatory Visit: Payer: Self-pay | Admitting: Dermatology

## 2021-09-15 LAB — HM DIABETES EYE EXAM

## 2021-10-19 DIAGNOSIS — H25811 Combined forms of age-related cataract, right eye: Secondary | ICD-10-CM | POA: Diagnosis not present

## 2021-10-19 DIAGNOSIS — H2511 Age-related nuclear cataract, right eye: Secondary | ICD-10-CM | POA: Diagnosis not present

## 2021-11-30 DIAGNOSIS — H2512 Age-related nuclear cataract, left eye: Secondary | ICD-10-CM | POA: Diagnosis not present

## 2021-11-30 DIAGNOSIS — H25812 Combined forms of age-related cataract, left eye: Secondary | ICD-10-CM | POA: Diagnosis not present

## 2022-01-09 ENCOUNTER — Ambulatory Visit (INDEPENDENT_AMBULATORY_CARE_PROVIDER_SITE_OTHER): Payer: PPO | Admitting: Nurse Practitioner

## 2022-01-09 ENCOUNTER — Encounter: Payer: Self-pay | Admitting: Nurse Practitioner

## 2022-01-09 VITALS — BP 172/110 | HR 71 | Temp 98.0°F | Resp 20 | Ht 63.0 in | Wt 169.0 lb

## 2022-01-09 DIAGNOSIS — E782 Mixed hyperlipidemia: Secondary | ICD-10-CM | POA: Diagnosis not present

## 2022-01-09 DIAGNOSIS — Z6829 Body mass index (BMI) 29.0-29.9, adult: Secondary | ICD-10-CM

## 2022-01-09 DIAGNOSIS — Z23 Encounter for immunization: Secondary | ICD-10-CM

## 2022-01-09 DIAGNOSIS — E119 Type 2 diabetes mellitus without complications: Secondary | ICD-10-CM | POA: Diagnosis not present

## 2022-01-09 DIAGNOSIS — I10 Essential (primary) hypertension: Secondary | ICD-10-CM

## 2022-01-09 LAB — BAYER DCA HB A1C WAIVED: HB A1C (BAYER DCA - WAIVED): 9.8 % — ABNORMAL HIGH (ref 4.8–5.6)

## 2022-01-09 MED ORDER — BLOOD GLUCOSE METER KIT: PACK | 0 refills | Status: DC

## 2022-01-09 MED ORDER — GLIPIZIDE 10 MG PO TABS: ORAL_TABLET | ORAL | 1 refills | Status: DC

## 2022-01-09 MED ORDER — SITAGLIPTIN PHOSPHATE 100 MG PO TABS: 100.0000 mg | ORAL_TABLET | Freq: Every day | ORAL | 1 refills | Status: DC

## 2022-01-09 MED ORDER — OLMESARTAN MEDOXOMIL 40 MG PO TABS: 40.0000 mg | ORAL_TABLET | Freq: Every day | ORAL | 1 refills | Status: DC

## 2022-01-09 MED ORDER — ATORVASTATIN CALCIUM 40 MG PO TABS: 40.0000 mg | ORAL_TABLET | Freq: Every day | ORAL | 1 refills | Status: DC

## 2022-01-09 NOTE — Addendum Note (Signed)
Addended by: Rolena Infante on: 01/09/2022 12:02 PM ? ? Modules accepted: Orders ? ?

## 2022-01-09 NOTE — Progress Notes (Signed)
? ?Subjective:  ? ? Patient ID: Kara Cortez, female    DOB: [birth year], 71 y.o.   MRN: [id number] ? ? ?Chief Complaint: medical management of chronic issues  ? ?  ? ?HPI: ? ?Kara Cortez is a 71 y.o. who identifies as a female who was assigned female at birth.  ? ?Social history: ?Lives with: lives with her husband ?Work history: retired ? ? ?Comes in today for follow up of the following chronic medical issues: ? ?1. Type 2 diabetes mellitus without complication, without long-term current use of insulin ([place]) ?She does not check her blood sugars anymore. She does try to watch diet some. We increased her glipizide to 11m in morning and 179mat night. She has stopped taking her meds several months ago ?Lab Results  ?Component Value Date  ? HGBA1C 9.2 (H) 12/06/2020  ? ? ? ?2. Primary hypertension ?No c/o chest pain, sob or headache. Does not check blood pressure at home. She has not been taking her blood pressure meds ?BP Readings from Last 3 Encounters:  ?12/06/20 139/84  ?09/07/20 136/88  ?06/04/20 (!) 130/94  ? ? ? ?3. Mixed hyperlipidemia ?Does not watch diet and does no dedicated exercise. ?Lab Results  ?Component Value Date  ? CHOL 157 12/06/2020  ? HDL 61 12/06/2020  ? LDM[place]8 12/06/2020  ? TRIG 102 12/06/2020  ? CHOLHDL 2.6 12/06/2020  ? ? ? ?4. BMI 29.0-29.9,adult ?No recent weight changes ?Wt Readings from Last 3 Encounters:  ?01/09/22 169 lb (76.7 kg)  ?12/06/20 171 lb (77.6 kg)  ?09/07/20 170 lb (77.1 kg)  ? ?BMI Readings from Last 3 Encounters:  ?01/09/22 29.94 kg/m?  ?12/06/20 30.29 kg/m?  ?09/07/20 30.11 kg/m?  ? ? ? ?New complaints: ?None today ? ?Allergies  ?Allergen Reactions  ? Amoxicillin Rash  ? ?Outpatient Encounter Medications as of 01/09/2022  ?Medication Sig  ? atorvastatin (LIPITOR) 40 MG tablet Take 1 tablet (40 mg total) by mouth daily.  ? Continuous Blood Gluc Sensor (FREESTYLE LIBRE 14 DAY SENSOR) MISC USE AS DIRECTED DAILY FOR 14 DAYS. CHANGE DEVICE EVERY 14 DAYS.  ?  glipiZIDE (GLUCOTROL) 10 MG tablet Take 2 po q am and 1 po qhs  ? olmesartan (BENICAR) 40 MG tablet Take 1 tablet (40 mg total) by mouth daily.  ? ONETOUCH DELICA LANCETS FINE MISC Test 1 x a day and prn  Dx E11.9  ? sitaGLIPtin-metformin (JANUMET) 50-1000 MG tablet Take 1 tablet by mouth 2 (two) times daily with a meal.  ? ?No facility-administered encounter medications on file as of 01/09/2022.  ? ? ?Past Surgical History:  ?Procedure Laterality Date  ? BUNIONECTOMY    ? TONSILLECTOMY    ? ? ?Family History  ?Problem Relation Age of Onset  ? Diabetes Mother   ? Hypertension Mother   ? Heart disease Mother   ? Hyperlipidemia Father   ? Cancer Father   ?     bladder  ? Emphysema Father   ? ? ? ? ?Controlled substance contract: n/a ? ? ? ? ?Review of Systems  ?Constitutional:  Negative for diaphoresis.  ?Eyes:  Negative for pain.  ?Respiratory:  Negative for shortness of breath.   ?Cardiovascular:  Negative for chest pain, palpitations and leg swelling.  ?Gastrointestinal:  Negative for abdominal pain.  ?Endocrine: Negative for polydipsia.  ?Skin:  Negative for rash.  ?Neurological:  Negative for dizziness, weakness and headaches.  ?Hematological:  Does not bruise/bleed easily.  ?All other systems reviewed and are  negative. ? ?   ?Objective:  ? Physical Exam ?Vitals and nursing note reviewed.  ?Constitutional:   ?   General: She is not in acute distress. ?   Appearance: Normal appearance. She is well-developed.  ?HENT:  ?   Head: Normocephalic.  ?   Right Ear: Tympanic membrane normal.  ?   Left Ear: Tympanic membrane normal.  ?   Nose: Nose normal.  ?   Mouth/Throat:  ?   Mouth: Mucous membranes are moist.  ?Eyes:  ?   Pupils: Pupils are equal, round, and reactive to light.  ?Neck:  ?   Vascular: No carotid bruit or JVD.  ?Cardiovascular:  ?   Rate and Rhythm: Normal rate and regular rhythm.  ?   Heart sounds: Normal heart sounds.  ?Pulmonary:  ?   Effort: Pulmonary effort is normal. No respiratory distress.  ?    Breath sounds: Normal breath sounds. No wheezing or rales.  ?Chest:  ?   Chest wall: No tenderness.  ?Abdominal:  ?   General: Bowel sounds are normal. There is no distension or abdominal bruit.  ?   Palpations: Abdomen is soft. There is no hepatomegaly, splenomegaly, mass or pulsatile mass.  ?   Tenderness: There is no abdominal tenderness.  ?Musculoskeletal:     ?   General: Normal range of motion.  ?   Cervical back: Normal range of motion and neck supple.  ?Lymphadenopathy:  ?   Cervical: No cervical adenopathy.  ?Skin: ?   General: Skin is warm and dry.  ?Neurological:  ?   Mental Status: She is alert and oriented to person, place, and time.  ?   Deep Tendon Reflexes: Reflexes are normal and symmetric.  ?Psychiatric:     ?   Behavior: Behavior normal.     ?   Thought Content: Thought content normal.     ?   Judgment: Judgment normal.  ? ?BP (!) 175/111   Pulse 71   Temp 98 ?F (36.7 ?C) (Temporal)   Resp 20   Ht '5\' 3"'  (1.6 m)   Wt 169 lb (76.7 kg)   SpO2 98%   BMI 29.94 kg/m?  ? ? ? ? ?   ?Assessment & Plan:  ? ?Kara Cortez comes in today with chief complaint of Medical Management of Chronic Issues ? ? ?Diagnosis and orders addressed: ? ?1. Type 2 diabetes mellitus without complication, without long-term current use of insulin ([place]) ?Back on januvua and glipizide daily ?Keep diary if blood sugars ?- Bayer DCA Hb A1c Waived ?- Microalbumin / creatinine urine ratio ?- sitaGLIPtin (JANUVIA) 100 MG tablet; Take 1 tablet (100 mg total) by mouth daily.  Dispense: 90 tablet; Refill: 1 ?- glipiZIDE (GLUCOTROL) 10 MG tablet; Take 2 po q am and 1 po qhs  Dispense: 270 tablet; Refill: 1 ? ?2. Primary hypertension ?Low sodium diet ?- CBC with Differential/Platelet ?- CMP14+EGFR ?- olmesartan (BENICAR) 40 MG tablet; Take 1 tablet (40 mg total) by mouth daily.  Dispense: 90 tablet; Refill: 1 ? ?3. Mixed hyperlipidemia ?Low fat diet ?- Lipid panel ?- atorvastatin (LIPITOR) 40 MG tablet; Take 1 tablet (40 mg  total) by mouth daily.  Dispense: 90 tablet; Refill: 1 ? ?4. BMI 29.0-29.9,adult ?Discussed diet and exercise for person with BMI >25 ?Will recheck weight in 3-6 months ? ? ? ?Labs pending ?Health Maintenance reviewed ?Diet and exercise encouraged ? ?Follow up plan: ?1 month diabetes only ? ? ?Mary-Margaret Hassell Done, FNP ? ?

## 2022-01-09 NOTE — Addendum Note (Signed)
Addended by: Rolena Infante on: 01/09/2022 04:31 PM ? ? Modules accepted: Orders ? ?

## 2022-01-09 NOTE — Patient Instructions (Signed)

## 2022-01-10 LAB — LIPID PANEL
Chol/HDL Ratio: 2.6 ratio (ref 0.0–4.4)
Cholesterol, Total: 177 mg/dL (ref 100–199)
HDL: 69 mg/dL (ref >39)
LDL Chol Calc (NIH): 89 mg/dL (ref 0–99)
Triglycerides: 108 mg/dL (ref 0–149)
VLDL Cholesterol Cal: 19 mg/dL (ref 5–40)

## 2022-01-10 LAB — CBC WITH DIFFERENTIAL/PLATELET
Basophils Absolute: 0.1 10*3/uL (ref 0.0–0.2)
Basos: 1 %
EOS (ABSOLUTE): 0.5 10*3/uL — ABNORMAL HIGH (ref 0.0–0.4)
Eos: 6 %
Hematocrit: 44.5 % (ref 34.0–46.6)
Hemoglobin: 14.3 g/dL (ref 11.1–15.9)
Immature Grans (Abs): 0 10*3/uL (ref 0.0–0.1)
Immature Granulocytes: 0 %
Lymphocytes Absolute: 2.5 10*3/uL (ref 0.7–3.1)
Lymphs: 30 %
MCH: 26.3 pg — ABNORMAL LOW (ref 26.6–33.0)
MCHC: 32.1 g/dL (ref 31.5–35.7)
MCV: 82 fL (ref 79–97)
Monocytes Absolute: 0.5 10*3/uL (ref 0.1–0.9)
Monocytes: 6 %
Neutrophils Absolute: 4.6 10*3/uL (ref 1.4–7.0)
Neutrophils: 57 %
Platelets: 256 10*3/uL (ref 150–450)
RBC: 5.43 x10E6/uL — ABNORMAL HIGH (ref 3.77–5.28)
RDW: 14.4 % (ref 11.7–15.4)
WBC: 8.2 10*3/uL (ref 3.4–10.8)

## 2022-01-10 LAB — CMP14+EGFR
ALT: 13 IU/L (ref 0–32)
AST: 13 IU/L (ref 0–40)
Albumin/Globulin Ratio: 2.2 (ref 1.2–2.2)
Albumin: 4.4 g/dL (ref 3.8–4.8)
Alkaline Phosphatase: 90 IU/L (ref 44–121)
BUN/Creatinine Ratio: 15 (ref 12–28)
BUN: 11 mg/dL (ref 8–27)
Bilirubin Total: 0.3 mg/dL (ref 0.0–1.2)
CO2: 25 mmol/L (ref 20–29)
Calcium: 9.5 mg/dL (ref 8.7–10.3)
Chloride: 100 mmol/L (ref 96–106)
Creatinine, Ser: 0.75 mg/dL (ref 0.57–1.00)
Globulin, Total: 2 g/dL (ref 1.5–4.5)
Glucose: 228 mg/dL — ABNORMAL HIGH (ref 70–99)
Potassium: 4.4 mmol/L (ref 3.5–5.2)
Sodium: 139 mmol/L (ref 134–144)
Total Protein: 6.4 g/dL (ref 6.0–8.5)
eGFR: 86 mL/min/{1.73_m2} (ref >59)

## 2022-01-11 LAB — MICROALBUMIN / CREATININE URINE RATIO
Creatinine, Urine: 17.6 mg/dL
Microalb/Creat Ratio: 51 mg/g creat — ABNORMAL HIGH (ref 0–29)
Microalbumin, Urine: 9 ug/mL

## 2022-01-13 ENCOUNTER — Telehealth: Payer: Self-pay | Admitting: Nurse Practitioner

## 2022-01-13 NOTE — Telephone Encounter (Signed)
Please review and advise.

## 2022-01-16 MED ORDER — FREESTYLE LIBRE 2 READER DEVI: 1.0000 | Freq: Every day | 0 refills | Status: AC

## 2022-01-16 MED ORDER — FREESTYLE LIBRE 2 SENSOR MISC: 1.0000 | 1 refills | Status: DC

## 2022-01-16 NOTE — Telephone Encounter (Signed)
Libre ordered ?

## 2022-01-16 NOTE — Telephone Encounter (Signed)
Left detailed message that [place] sent to pharmacy and to contact office with any further needs.  ?

## 2022-02-07 ENCOUNTER — Ambulatory Visit: Payer: PPO | Admitting: Nurse Practitioner

## 2022-02-14 ENCOUNTER — Telehealth: Payer: Self-pay | Admitting: *Deleted

## 2022-02-14 ENCOUNTER — Encounter: Payer: Self-pay | Admitting: Nurse Practitioner

## 2022-02-14 ENCOUNTER — Ambulatory Visit (INDEPENDENT_AMBULATORY_CARE_PROVIDER_SITE_OTHER): Payer: PPO | Admitting: Nurse Practitioner

## 2022-02-14 VITALS — BP 155/87 | HR 72 | Temp 98.2°F | Resp 20 | Wt 171.0 lb

## 2022-02-14 DIAGNOSIS — Z6829 Body mass index (BMI) 29.0-29.9, adult: Secondary | ICD-10-CM

## 2022-02-14 DIAGNOSIS — I10 Essential (primary) hypertension: Secondary | ICD-10-CM | POA: Diagnosis not present

## 2022-02-14 DIAGNOSIS — E782 Mixed hyperlipidemia: Secondary | ICD-10-CM | POA: Diagnosis not present

## 2022-02-14 DIAGNOSIS — E119 Type 2 diabetes mellitus without complications: Secondary | ICD-10-CM

## 2022-02-14 LAB — BAYER DCA HB A1C WAIVED: HB A1C (BAYER DCA - WAIVED): 9.9 % — ABNORMAL HIGH (ref 4.8–5.6)

## 2022-02-14 MED ORDER — OZEMPIC (0.25 OR 0.5 MG/DOSE) 2 MG/1.5ML ~~LOC~~ SOPN: 0.5000 mg | PEN_INJECTOR | SUBCUTANEOUS | 2 refills | Status: DC

## 2022-02-14 NOTE — Patient Instructions (Signed)

## 2022-02-14 NOTE — Progress Notes (Addendum)
? ?Subjective:  ? ? Patient ID: Kara Cortez, female    DOB: [birth year], 71 y.o.   MRN: [id number] ? ? ?Chief Complaint: medical management of chronic issues  ?  ? ?HPI: ? ?Kara Cortez is Cortez 71 y.o. who identifies as Cortez female who was assigned female at birth.  ? ?Social history: ?Lives with: husband ?Work history: retired ? ? ?Comes in today for follow up of the following chronic medical issues: ? ?1. Primary hypertension ?No c/o chest pain, sob or headache. Does not check blood pressure at home. ?BP Readings from Last 3 Encounters:  ?01/09/22 (!) 172/110  ?12/06/20 139/84  ?09/07/20 136/88  ? ? ? ?2. Mixed hyperlipidemia ?Does not watch diet and does no dedicated exercise. ?Lab Results  ?Component Value Date  ? CHOL 177 01/09/2022  ? HDL 69 01/09/2022  ? [place] 89 01/09/2022  ? TRIG 108 01/09/2022  ? CHOLHDL 2.6 01/09/2022  ? ? ? ?3. Type 2 diabetes mellitus without complication, without long-term current use of insulin ([place]) ?At last visit she had not been checking her blood sugars. She had also stopped taking her meds . We added januvia and glipizide back to her meds. Her blood sugars have been running high. Average is 239. ? ?4. BMI 29.0-29.9,adult ?No recent weight changes ?Wt Readings from Last 3 Encounters:  ?02/14/22 171 lb (77.6 kg)  ?01/09/22 169 lb (76.7 kg)  ?12/06/20 171 lb (77.6 kg)  ? ?BMI Readings from Last 3 Encounters:  ?02/14/22 30.29 kg/m?  ?01/09/22 29.94 kg/m?  ?12/06/20 30.29 kg/m?  ? ? ? ?New complaints: ?None today ? ?Allergies  ?Allergen Reactions  ? Amoxicillin Rash  ? ?Outpatient Encounter Medications as of 02/14/2022  ?Medication Sig  ? atorvastatin (LIPITOR) 40 MG tablet Take 1 tablet (40 mg total) by mouth daily.  ? blood glucose meter kit and supplies Dispense based on patient and insurance preference. Use up to four times daily as directed. (FOR ICD-10 E10.9, E11.9).  ? Continuous Blood Gluc Receiver (FREESTYLE LIBRE 2 READER) DEVI 1 each by Does not apply route daily.   ? Continuous Blood Gluc Sensor (FREESTYLE LIBRE 14 DAY SENSOR) MISC USE AS DIRECTED DAILY FOR 14 DAYS. CHANGE DEVICE EVERY 14 DAYS. (Patient not taking: Reported on 01/09/2022)  ? Continuous Blood Gluc Sensor (FREESTYLE LIBRE 2 SENSOR) MISC 1 each by Does not apply route every 14 (fourteen) days.  ? glipiZIDE (GLUCOTROL) 10 MG tablet Take 2 po q am and 1 po qhs  ? olmesartan (BENICAR) 40 MG tablet Take 1 tablet (40 mg total) by mouth daily.  ? ONETOUCH DELICA LANCETS FINE MISC Test 1 x Cortez day and prn  Dx E11.9  ? sitaGLIPtin (JANUVIA) 100 MG tablet Take 1 tablet (100 mg total) by mouth daily.  ? ?No facility-administered encounter medications on file as of 02/14/2022.  ? ? ?Past Surgical History:  ?Procedure Laterality Date  ? BUNIONECTOMY    ? TONSILLECTOMY    ? ? ?Family History  ?Problem Relation Age of Onset  ? Diabetes Mother   ? Hypertension Mother   ? Heart disease Mother   ? Hyperlipidemia Father   ? Cancer Father   ?     bladder  ? Emphysema Father   ? ? ? ? ?Controlled substance contract: n/Cortez ? ? ? ? ?Review of Systems  ?Constitutional:  Negative for diaphoresis.  ?Eyes:  Negative for pain.  ?Respiratory:  Negative for shortness of breath.   ?Cardiovascular:  Negative for chest pain,  palpitations and leg swelling.  ?Gastrointestinal:  Negative for abdominal pain.  ?Endocrine: Negative for polydipsia.  ?Skin:  Negative for rash.  ?Neurological:  Negative for dizziness, weakness and headaches.  ?Hematological:  Does not bruise/bleed easily.  ?All other systems reviewed and are negative. ? ?   ?Objective:  ? Physical Exam ?Vitals and nursing note reviewed.  ?Constitutional:   ?   General: She is not in acute distress. ?   Appearance: Normal appearance. She is well-developed.  ?Neck:  ?   Vascular: No carotid bruit or JVD.  ?Cardiovascular:  ?   Rate and Rhythm: Normal rate and regular rhythm.  ?   Heart sounds: Normal heart sounds.  ?Pulmonary:  ?   Effort: Pulmonary effort is normal. No respiratory distress.   ?   Breath sounds: Normal breath sounds. No wheezing or rales.  ?Chest:  ?   Chest wall: No tenderness.  ?Abdominal:  ?   General: Bowel sounds are normal. There is no distension or abdominal bruit.  ?   Palpations: Abdomen is soft. There is no hepatomegaly, splenomegaly, mass or pulsatile mass.  ?   Tenderness: There is no abdominal tenderness.  ?Musculoskeletal:     ?   General: Normal range of motion.  ?   Cervical back: Normal range of motion and neck supple.  ?Lymphadenopathy:  ?   Cervical: No cervical adenopathy.  ?Skin: ?   General: Skin is warm and dry.  ?Neurological:  ?   Mental Status: She is alert and oriented to person, place, and time.  ?   Deep Tendon Reflexes: Reflexes are normal and symmetric.  ?Psychiatric:     ?   Behavior: Behavior normal.     ?   Thought Content: Thought content normal.     ?   Judgment: Judgment normal.  ? ? ? ?BP (!) 155/87   Pulse 72   Temp 98.2 ?F (36.8 ?C) (Temporal)   Resp 20   Wt 171 lb (77.6 kg)   SpO2 97%   BMI 30.29 kg/m?  ? ?Hgba1c 9.9 ?   ?Assessment & Plan:  ?Kara Cortez comes in today with chief complaint of Diabetes ? ? ?Diagnosis and orders addressed: ? ?1. Primary hypertension ?Low sodium diet ? ?2. Mixed hyperlipidemia ?Low fat diet ? ?3. Type 2 diabetes mellitus without complication, without long-term current use of insulin ([place]) ?Added ozewmpic .025 for 2 weeks the .05 weekly ?Stop [place] ?- Bayer DCA Hb A1c Waived ? ?4. BMI 29.0-29.9,adult ?Discussed diet and exercise for person with BMI >25 ?Will recheck weight in 3-6 months ? ? ?Meds ordered this encounter  ?Medications  ? Semaglutide,0.25 or 0.5MG/DOS, (OZEMPIC, 0.25 OR 0.5 MG/DOSE,) 2 MG/1.5ML SOPN  ?  Sig: Inject 0.5 mg into the skin once Cortez week.  ?  Dispense:  1.5 mL  ?  Refill:  2  ?  Order Specific Question:   Supervising Provider  ?  Answer:   Kara Cortez [[id number]]  ? ? ?Labs pending ?Health Maintenance reviewed ?Diet and exercise encouraged ? ?Follow up plan: ?1  month ? ? ?Kara Hassell Done, FNP ? ? ?

## 2022-02-14 NOTE — Telephone Encounter (Signed)
Kara Cortez (Key: [id number]) ?Rx #: [id number] ?Ozempic (0.25 or 0.5 MG/DOSE) '2MG'$ /3ML pen-injectors ? ? ? ?Sent to plan ? ?APPROVED ?

## 2022-03-16 ENCOUNTER — Ambulatory Visit (INDEPENDENT_AMBULATORY_CARE_PROVIDER_SITE_OTHER): Payer: PPO | Admitting: Nurse Practitioner

## 2022-03-16 ENCOUNTER — Encounter: Payer: Self-pay | Admitting: Nurse Practitioner

## 2022-03-16 VITALS — BP 139/88 | HR 70 | Temp 98.0°F | Resp 20 | Ht 63.0 in | Wt 169.0 lb

## 2022-03-16 DIAGNOSIS — Z6829 Body mass index (BMI) 29.0-29.9, adult: Secondary | ICD-10-CM | POA: Diagnosis not present

## 2022-03-16 DIAGNOSIS — I10 Essential (primary) hypertension: Secondary | ICD-10-CM | POA: Diagnosis not present

## 2022-03-16 DIAGNOSIS — E119 Type 2 diabetes mellitus without complications: Secondary | ICD-10-CM | POA: Diagnosis not present

## 2022-03-16 DIAGNOSIS — E782 Mixed hyperlipidemia: Secondary | ICD-10-CM | POA: Diagnosis not present

## 2022-03-16 LAB — BAYER DCA HB A1C WAIVED: HB A1C (BAYER DCA - WAIVED): 8.8 % — ABNORMAL HIGH (ref 4.8–5.6)

## 2022-03-16 MED ORDER — SEMAGLUTIDE (1 MG/DOSE) 4 MG/3ML ~~LOC~~ SOPN: 1.0000 mg | PEN_INJECTOR | SUBCUTANEOUS | 3 refills | Status: DC

## 2022-03-16 NOTE — Progress Notes (Signed)
Subjective:    Patient ID: Kara Cortez, female    DOB: 02-Oct-1951, 71 y.o.   MRN: [id number]   Chief Complaint: medical management of chronic issues     HPI:  Kara Cortez is a 71 y.o. who identifies as a female who was assigned female at birth.   Social history: Lives with: husband Work history: retired   Scientist, forensic in today for follow up of the following chronic medical issues:  1. Primary hypertension No c/o chest pain, sob or headache, does not check blood pressure at home. BP Readings from Last 3 Encounters:  03/16/22 139/88  02/14/22 (!) 155/87  01/09/22 (!) 172/110      2. Mixed hyperlipidemia Does not watch diet and does no dedicated exercise. Lab Results  Component Value Date   CHOL 177 01/09/2022   HDL 69 01/09/2022   LDLCALC 89 01/09/2022   TRIG 108 01/09/2022   CHOLHDL 2.6 01/09/2022   The 10-year ASCVD risk score (Arnett DK, et al., 2019) is: 26.7%   3. Type 2 diabetes mellitus without complication, without long-term current use of insulin ([facility]) Last visit her blood sugars had been running high. She had stopped taking her meds. We stopped [place] and added ozempic at last visit. Her blood sugars have been running around 120-170. Has had a few 200. Lab Results  Component Value Date   HGBA1C 9.9 (H) 02/14/2022     4. BMI 29.0-29.9,adult No recent weight changes Wt Readings from Last 3 Encounters:  03/16/22 169 lb (76.7 kg)  02/14/22 171 lb (77.6 kg)  01/09/22 169 lb (76.7 kg)   BMI Readings from Last 3 Encounters:  03/16/22 29.94 kg/m  02/14/22 30.29 kg/m  01/09/22 29.94 kg/m     New complaints: None today  Allergies  Allergen Reactions   Amoxicillin Rash   Outpatient Encounter Medications as of 03/16/2022  Medication Sig   atorvastatin (LIPITOR) 40 MG tablet Take 1 tablet (40 mg total) by mouth daily.   blood glucose meter kit and supplies Dispense based on patient and insurance preference. Use up to four times daily  as directed. (FOR ICD-10 E10.9, E11.9).   Continuous Blood Gluc Receiver (FREESTYLE LIBRE 2 READER) DEVI 1 each by Does not apply route daily.   Continuous Blood Gluc Sensor (FREESTYLE LIBRE 14 DAY SENSOR) MISC USE AS DIRECTED DAILY FOR 14 DAYS. CHANGE DEVICE EVERY 14 DAYS.   Continuous Blood Gluc Sensor (FREESTYLE LIBRE 2 SENSOR) MISC 1 each by Does not apply route every 14 (fourteen) days.   glipiZIDE (GLUCOTROL) 10 MG tablet Take 2 po q am and 1 po qhs   olmesartan (BENICAR) 40 MG tablet Take 1 tablet (40 mg total) by mouth daily.   ONETOUCH DELICA LANCETS FINE MISC Test 1 x a day and prn  Dx E11.9   Semaglutide,0.25 or 0.5MG/DOS, (OZEMPIC, 0.25 OR 0.5 MG/DOSE,) 2 MG/1.5ML SOPN Inject 0.5 mg into the skin once a week.   sitaGLIPtin (JANUVIA) 100 MG tablet Take 1 tablet (100 mg total) by mouth daily.   No facility-administered encounter medications on file as of 03/16/2022.    Past Surgical History:  Procedure Laterality Date   BUNIONECTOMY     TONSILLECTOMY      Family History  Problem Relation Age of Onset   Diabetes Mother    Hypertension Mother    Heart disease Mother    Hyperlipidemia Father    Cancer Father        bladder   Emphysema Father  Controlled substance contract: n/a     Review of Systems  Constitutional:  Negative for diaphoresis.  Eyes:  Negative for pain.  Respiratory:  Negative for shortness of breath.   Cardiovascular:  Negative for chest pain, palpitations and leg swelling.  Gastrointestinal:  Negative for abdominal pain.  Endocrine: Negative for polydipsia.  Skin:  Negative for rash.  Neurological:  Negative for dizziness, weakness and headaches.  Hematological:  Does not bruise/bleed easily.  All other systems reviewed and are negative.      Objective:   Physical Exam Vitals and nursing note reviewed.  Constitutional:      General: She is not in acute distress.    Appearance: Normal appearance. She is well-developed.  Neck:      Vascular: No carotid bruit or JVD.  Cardiovascular:     Rate and Rhythm: Normal rate and regular rhythm.     Heart sounds: Normal heart sounds.  Pulmonary:     Effort: Pulmonary effort is normal. No respiratory distress.     Breath sounds: Normal breath sounds. No wheezing or rales.  Chest:     Chest wall: No tenderness.  Abdominal:     General: Bowel sounds are normal. There is no distension or abdominal bruit.     Palpations: Abdomen is soft. There is no hepatomegaly, splenomegaly, mass or pulsatile mass.     Tenderness: There is no abdominal tenderness.  Musculoskeletal:        General: Normal range of motion.     Cervical back: Normal range of motion and neck supple.  Lymphadenopathy:     Cervical: No cervical adenopathy.  Skin:    General: Skin is warm and dry.  Neurological:     Mental Status: She is alert and oriented to person, place, and time.     Deep Tendon Reflexes: Reflexes are normal and symmetric.  Psychiatric:        Behavior: Behavior normal.        Thought Content: Thought content normal.        Judgment: Judgment normal.     BP 139/88   Pulse 70   Temp 98 F (36.7 C) (Temporal)   Resp 20   Ht '5\' 3"'  (1.6 m)   Wt 169 lb (76.7 kg)   SpO2 98%   BMI 29.94 kg/m   HGBA1c 8.8%      Assessment & Plan:  Kara Cortez comes in today with chief complaint of Diabetes   Diagnosis and orders addressed:  1. Primary hypertension Low sodium diet  2. Mixed hyperlipidemia Low fat diet  3. Type 2 diabetes mellitus without complication, without long-term current use of insulin (HCC) Increased ozempic to 51m weekly Continue to keep check of blood sugars - Bayer DCA Hb A1c Waived - Semaglutide, 1 MG/DOSE, 4 MG/3ML SOPN; Inject 1 mg as directed once a week.  Dispense: 3 mL; Refill: 3  4. BMI 29.0-29.9,adult Discussed diet and exercise for person with BMI >25 Will recheck weight in 3-6 months    Labs pending Health Maintenance reviewed Diet and  exercise encouraged  Follow up plan: 3 months   Mary-Margaret MHassell Done FNP

## 2022-03-16 NOTE — Patient Instructions (Signed)

## 2022-04-14 ENCOUNTER — Ambulatory Visit: Payer: PPO | Admitting: Podiatry

## 2022-04-21 ENCOUNTER — Ambulatory Visit (INDEPENDENT_AMBULATORY_CARE_PROVIDER_SITE_OTHER): Payer: PPO | Admitting: Podiatry

## 2022-04-21 ENCOUNTER — Ambulatory Visit (INDEPENDENT_AMBULATORY_CARE_PROVIDER_SITE_OTHER): Payer: PPO

## 2022-04-21 DIAGNOSIS — M7661 Achilles tendinitis, right leg: Secondary | ICD-10-CM | POA: Diagnosis not present

## 2022-04-21 DIAGNOSIS — M779 Enthesopathy, unspecified: Secondary | ICD-10-CM

## 2022-04-21 NOTE — Progress Notes (Signed)
Subjective:  Patient ID: Kara Cortez, female    DOB: November 26, 1950,  MRN: [id number]  Chief Complaint  Patient presents with   R heel pain, swelling    R heel pain, swelling    71 y.o. female presents with the above complaint.  Patient presents with right posterior Achilles pain.  Patient states that she developed pain.  She is a diabetic.  She states hurts with ambulation has progressive gotten worse.  She wanted to get it evaluated she has not seen and was prior to seeing me.  Pain scale 7 out of 10 hurts with pressure.  Hurts with palpation or swelling   Review of Systems: Negative except as noted in the HPI. Denies N/V/F/Ch.  Past Medical History:  Diagnosis Date   Diabetes mellitus without complication (HCC)    Hypertension     Current Outpatient Medications:    atorvastatin (LIPITOR) 40 MG tablet, Take 1 tablet (40 mg total) by mouth daily., Disp: 90 tablet, Rfl: 1   blood glucose meter kit and supplies, Dispense based on patient and insurance preference. Use up to four times daily as directed. (FOR ICD-10 E10.9, E11.9)., Disp: 1 each, Rfl: 0   Continuous Blood Gluc Receiver (FREESTYLE LIBRE 2 READER) DEVI, 1 each by Does not apply route daily., Disp: 1 each, Rfl: 0   Continuous Blood Gluc Sensor (FREESTYLE LIBRE 14 DAY SENSOR) MISC, USE AS DIRECTED DAILY FOR 14 DAYS. CHANGE DEVICE EVERY 14 DAYS., Disp: 2 each, Rfl: 5   Continuous Blood Gluc Sensor (FREESTYLE LIBRE 2 SENSOR) MISC, 1 each by Does not apply route every 14 (fourteen) days., Disp: 6 each, Rfl: 1   glipiZIDE (GLUCOTROL) 10 MG tablet, Take 2 po q am and 1 po qhs, Disp: 270 tablet, Rfl: 1   olmesartan (BENICAR) 40 MG tablet, Take 1 tablet (40 mg total) by mouth daily., Disp: 90 tablet, Rfl: 1   ONETOUCH DELICA LANCETS FINE MISC, Test 1 x a day and prn  Dx E11.9, Disp: 100 each, Rfl: 3   Semaglutide, 1 MG/DOSE, 4 MG/3ML SOPN, Inject 1 mg as directed once a week., Disp: 3 mL, Rfl: 3   sitaGLIPtin (JANUVIA) 100 MG  tablet, Take 1 tablet (100 mg total) by mouth daily., Disp: 90 tablet, Rfl: 1  Social History   Tobacco Use  Smoking Status Never  Smokeless Tobacco Never    Allergies  Allergen Reactions   Amoxicillin Rash   Objective:  There were no vitals filed for this visit. There is no height or weight on file to calculate BMI. Constitutional Well developed. Well nourished.  Vascular Dorsalis pedis pulses palpable bilaterally. Posterior tibial pulses palpable bilaterally. Capillary refill normal to all digits.  No cyanosis or clubbing noted. Pedal hair growth normal.  Neurologic Normal speech. Oriented to person, place, and time. Epicritic sensation to light touch grossly present bilaterally.  Dermatologic Nails well groomed and normal in appearance. No open wounds. No skin lesions.  Orthopedic: Pain on palpation of right Achilles tendon insertion.  Pain with dorsiflexion of the ankle joint no pain with plantarflexion of the ankle joint positive Silfverskiold test with gastrocnemius equinus.  Pain along the course of the Achilles tendon no pain at the posterior tibial tendon peroneal tendon ATFL ligament.  Haglund's deformity clinically appreciated   Radiographs: 3 views of skeletally mature the right foot: Haglund's deformity noted posterior heel spurring notedKager's triangle within normal limits.  No signs of acute tearing noted.  Tendinosis of the Achilles tendon noted midfoot arthritis  noted. Assessment:   1. Achilles tendinitis, right leg    Plan:  Patient was evaluated and treated and all questions answered.  Right Achilles tendinitis -All questions and concerns were discussed with the patient in extensive detail given the amount of pain that she is having she will benefit from a cam boot immobilization .  I discussed with patient in extensive detail she states understanding.  She would like to proceed with cam boot immobilization -Cam boot was dispensed -If there is no  improvement we will discuss steroid injection versus MRI  No follow-ups on file.

## 2022-05-16 ENCOUNTER — Telehealth: Payer: Self-pay | Admitting: Podiatry

## 2022-05-16 NOTE — Telephone Encounter (Signed)
Left message for Kara Cortez of what Dr Posey Pronto said that as long as pain is improving she can start to transition out of the boot and to call if any further questions.

## 2022-05-16 NOTE — Telephone Encounter (Signed)
Pt was seen on 7.21 and was put in a boot for 4 wks she was scheduled to see Dr Posey Pronto on 9.6 but is going out of town and changed to 9.13. Does she need to continue wearing the cam boot until her next appt even if it is longer thatn the 4 wks? She is hoping not to have to wear it when she is out of town.

## 2022-06-07 ENCOUNTER — Ambulatory Visit: Payer: PPO | Admitting: Podiatry

## 2022-06-14 ENCOUNTER — Ambulatory Visit (INDEPENDENT_AMBULATORY_CARE_PROVIDER_SITE_OTHER): Payer: PPO | Admitting: Podiatry

## 2022-06-14 DIAGNOSIS — M7661 Achilles tendinitis, right leg: Secondary | ICD-10-CM

## 2022-06-14 DIAGNOSIS — Q666 Other congenital valgus deformities of feet: Secondary | ICD-10-CM

## 2022-06-14 NOTE — Progress Notes (Signed)
Subjective:  Patient ID: Kara Cortez, female    DOB: August 03, 1951,  MRN: [id number]  Chief Complaint  Patient presents with   Foot Pain    71 y.o. female presents with the above complaint.  Patient presents with right posterior Achilles pain.  Patient states that she developed pain.  She is a diabetic.  She states hurts with ambulation has progressive gotten worse.  She wanted to get it evaluated she has not seen and was prior to seeing me.  Pain scale 7 out of 10 hurts with pressure.  Hurts with palpation or swelling   Review of Systems: Negative except as noted in the HPI. Denies N/V/F/Ch.  Past Medical History:  Diagnosis Date   Diabetes mellitus without complication (HCC)    Hypertension     Current Outpatient Medications:    atorvastatin (LIPITOR) 40 MG tablet, Take 1 tablet (40 mg total) by mouth daily., Disp: 90 tablet, Rfl: 1   blood glucose meter kit and supplies, Dispense based on patient and insurance preference. Use up to four times daily as directed. (FOR ICD-10 E10.9, E11.9)., Disp: 1 each, Rfl: 0   Continuous Blood Gluc Receiver (FREESTYLE LIBRE 2 READER) DEVI, 1 each by Does not apply route daily., Disp: 1 each, Rfl: 0   Continuous Blood Gluc Sensor (FREESTYLE LIBRE 14 DAY SENSOR) MISC, USE AS DIRECTED DAILY FOR 14 DAYS. CHANGE DEVICE EVERY 14 DAYS., Disp: 2 each, Rfl: 5   Continuous Blood Gluc Sensor (FREESTYLE LIBRE 2 SENSOR) MISC, 1 each by Does not apply route every 14 (fourteen) days., Disp: 6 each, Rfl: 1   glipiZIDE (GLUCOTROL) 10 MG tablet, Take 2 po q am and 1 po qhs, Disp: 270 tablet, Rfl: 1   olmesartan (BENICAR) 40 MG tablet, Take 1 tablet (40 mg total) by mouth daily., Disp: 90 tablet, Rfl: 1   ONETOUCH DELICA LANCETS FINE MISC, Test 1 x a day and prn  Dx E11.9, Disp: 100 each, Rfl: 3   Semaglutide, 1 MG/DOSE, 4 MG/3ML SOPN, Inject 1 mg as directed once a week., Disp: 3 mL, Rfl: 3   sitaGLIPtin (JANUVIA) 100 MG tablet, Take 1 tablet (100 mg total) by  mouth daily., Disp: 90 tablet, Rfl: 1  Social History   Tobacco Use  Smoking Status Never  Smokeless Tobacco Never    Allergies  Allergen Reactions   Amoxicillin Rash   Objective:  There were no vitals filed for this visit. There is no height or weight on file to calculate BMI. Constitutional Well developed. Well nourished.  Vascular Dorsalis pedis pulses palpable bilaterally. Posterior tibial pulses palpable bilaterally. Capillary refill normal to all digits.  No cyanosis or clubbing noted. Pedal hair growth normal.  Neurologic Normal speech. Oriented to person, place, and time. Epicritic sensation to light touch grossly present bilaterally.  Dermatologic Nails well groomed and normal in appearance. No open wounds. No skin lesions.  Orthopedic: Pain on palpation of right Achilles tendon insertion.  Pain with dorsiflexion of the ankle joint no pain with plantarflexion of the ankle joint positive Silfverskiold test with gastrocnemius equinus.  Pain along the course of the Achilles tendon no pain at the posterior tibial tendon peroneal tendon ATFL ligament.  Haglund's deformity clinically appreciated   Radiographs: 3 views of skeletally mature the right foot: Haglund's deformity noted posterior heel spurring notedKager's triangle within normal limits.  No signs of acute tearing noted.  Tendinosis of the Achilles tendon noted midfoot arthritis noted. Assessment:   1. Achilles tendinitis, right leg  2. Pes planovalgus     Plan:  Patient was evaluated and treated and all questions answered.  Right Achilles tendinitis -All questions and concerns were discussed with the patient in extensive detail -New Cam boot immobilization also believe she will benefit from a steroid injection at decreasing inflammatory component associate with pain.  I discussed with her that given that there is no tenderness risk of rupture associate with it.  She states understanding like to proceed with  injection. -A steroid injection was performed at right Kager's fat pad using 1% plain Lidocaine and 10 mg of Kenalog. This was well tolerated.  Pes planovalgus -I explained to patient the etiology of pes planovalgus and relationship with this Achilles tendinitis and various treatment options were discussed.  Given patient foot structure in the setting of Achilles tendinitis I believe patient will benefit from custom-made orthotics to help control the hindfoot motion support the arch of the foot and take the stress away from plantar fascial.  Patient agrees with the plan like to proceed with orthotics -Patient was casted for orthotics    No follow-ups on file.  Right Achilles tendinitis injection pes planovalgus orthotics

## 2022-06-16 ENCOUNTER — Encounter: Payer: Self-pay | Admitting: Nurse Practitioner

## 2022-06-16 ENCOUNTER — Ambulatory Visit (INDEPENDENT_AMBULATORY_CARE_PROVIDER_SITE_OTHER): Payer: PPO | Admitting: Nurse Practitioner

## 2022-06-16 VITALS — BP 136/88 | HR 71 | Temp 98.3°F | Resp 20 | Ht 63.0 in | Wt 165.0 lb

## 2022-06-16 DIAGNOSIS — Z6829 Body mass index (BMI) 29.0-29.9, adult: Secondary | ICD-10-CM | POA: Diagnosis not present

## 2022-06-16 DIAGNOSIS — I10 Essential (primary) hypertension: Secondary | ICD-10-CM

## 2022-06-16 DIAGNOSIS — E782 Mixed hyperlipidemia: Secondary | ICD-10-CM

## 2022-06-16 DIAGNOSIS — E119 Type 2 diabetes mellitus without complications: Secondary | ICD-10-CM

## 2022-06-16 DIAGNOSIS — J301 Allergic rhinitis due to pollen: Secondary | ICD-10-CM

## 2022-06-16 LAB — CBC WITH DIFFERENTIAL/PLATELET
Basophils Absolute: 0.1 10*3/uL (ref 0.0–0.2)
Basos: 1 %
EOS (ABSOLUTE): 0.1 10*3/uL (ref 0.0–0.4)
Eos: 2 %
Hematocrit: 41.3 % (ref 34.0–46.6)
Hemoglobin: 13.6 g/dL (ref 11.1–15.9)
Immature Grans (Abs): 0 10*3/uL (ref 0.0–0.1)
Immature Granulocytes: 1 %
Lymphocytes Absolute: 2.2 10*3/uL (ref 0.7–3.1)
Lymphs: 26 %
MCH: 27.2 pg (ref 26.6–33.0)
MCHC: 32.9 g/dL (ref 31.5–35.7)
MCV: 83 fL (ref 79–97)
Monocytes Absolute: 0.4 10*3/uL (ref 0.1–0.9)
Monocytes: 4 %
Neutrophils Absolute: 5.6 10*3/uL (ref 1.4–7.0)
Neutrophils: 66 %
Platelets: 228 10*3/uL (ref 150–450)
RBC: 5 x10E6/uL (ref 3.77–5.28)
RDW: 13.8 % (ref 11.7–15.4)
WBC: 8.4 10*3/uL (ref 3.4–10.8)

## 2022-06-16 LAB — CMP14+EGFR
ALT: 11 IU/L (ref 0–32)
AST: 13 IU/L (ref 0–40)
Albumin/Globulin Ratio: 2.4 — ABNORMAL HIGH (ref 1.2–2.2)
Albumin: 4.5 g/dL (ref 3.9–4.9)
Alkaline Phosphatase: 95 IU/L (ref 44–121)
BUN/Creatinine Ratio: 16 (ref 12–28)
BUN: 13 mg/dL (ref 8–27)
Bilirubin Total: 0.4 mg/dL (ref 0.0–1.2)
CO2: 24 mmol/L (ref 20–29)
Calcium: 9.4 mg/dL (ref 8.7–10.3)
Chloride: 102 mmol/L (ref 96–106)
Creatinine, Ser: 0.83 mg/dL (ref 0.57–1.00)
Globulin, Total: 1.9 g/dL (ref 1.5–4.5)
Glucose: 153 mg/dL — ABNORMAL HIGH (ref 70–99)
Potassium: 4.3 mmol/L (ref 3.5–5.2)
Sodium: 140 mmol/L (ref 134–144)
Total Protein: 6.4 g/dL (ref 6.0–8.5)
eGFR: 76 mL/min/{1.73_m2} (ref >59)

## 2022-06-16 LAB — LIPID PANEL
Chol/HDL Ratio: 2.2 ratio (ref 0.0–4.4)
Cholesterol, Total: 164 mg/dL (ref 100–199)
HDL: 73 mg/dL (ref >39)
LDL Chol Calc (NIH): 67 mg/dL (ref 0–99)
Triglycerides: 140 mg/dL (ref 0–149)
VLDL Cholesterol Cal: 24 mg/dL (ref 5–40)

## 2022-06-16 LAB — BAYER DCA HB A1C WAIVED: HB A1C (BAYER DCA - WAIVED): 7.8 % — ABNORMAL HIGH (ref 4.8–5.6)

## 2022-06-16 MED ORDER — OLMESARTAN MEDOXOMIL 40 MG PO TABS
40.0000 mg | ORAL_TABLET | Freq: Every day | ORAL | 1 refills | Status: DC
Start: 2022-06-16 — End: 2022-09-21

## 2022-06-16 MED ORDER — SITAGLIPTIN PHOSPHATE 100 MG PO TABS
100.0000 mg | ORAL_TABLET | Freq: Every day | ORAL | 1 refills | Status: DC
Start: 2022-06-16 — End: 2022-09-21

## 2022-06-16 MED ORDER — GLIPIZIDE 10 MG PO TABS: ORAL_TABLET | ORAL | 1 refills | Status: DC

## 2022-06-16 MED ORDER — ATORVASTATIN CALCIUM 40 MG PO TABS: 40.0000 mg | ORAL_TABLET | Freq: Every day | ORAL | 1 refills | Status: DC

## 2022-06-16 MED ORDER — SEMAGLUTIDE (1 MG/DOSE) 4 MG/3ML ~~LOC~~ SOPN: 1.0000 mg | PEN_INJECTOR | SUBCUTANEOUS | 3 refills | Status: DC

## 2022-06-16 NOTE — Patient Instructions (Signed)

## 2022-06-16 NOTE — Progress Notes (Signed)
Subjective:    Patient ID: Kara Cortez, female    DOB: 12-Mar-1951, 71 y.o.   MRN: [id number]   Chief Complaint: medical management of chronic issues     HPI:  Kara Cortez is a 71 y.o. who identifies as a female who was assigned female at birth.   Social history: Lives with: husband Work history: retired   Scientist, forensic in today for follow up of the following chronic medical issues:  1. Primary hypertension No c/o chest pain, sob or headache. Does not check blood pressure at home. BP Readings from Last 3 Encounters:  06/16/22 (!) 160/91  03/16/22 139/88  02/14/22 (!) 155/87      2. Mixed hyperlipidemia Does try to watch diet but does no dedicated exercise. Lab Results  Component Value Date   CHOL 177 01/09/2022   HDL 69 01/09/2022   LDLCALC 89 01/09/2022   TRIG 108 01/09/2022   CHOLHDL 2.6 01/09/2022   The 10-year ASCVD risk score (Arnett DK, et al., 2019) is: 24.7%   3. Type 2 diabetes mellitus without complication, without long-term current use of insulin (HCC) Fasting blood sugars are running around 80-150. No low blood sugars. Lab Results  Component Value Date   HGBA1C 8.8 (H) 03/16/2022     4. Seasonal allergic rhinitis due to pollen Takes OTC allergy meds as needed  5. BMI 29.0-29.9,adult Weight is down 4 lbs Wt Readings from Last 3 Encounters:  06/16/22 165 lb (74.8 kg)  03/16/22 169 lb (76.7 kg)  02/14/22 171 lb (77.6 kg)   BMI Readings from Last 3 Encounters:  06/16/22 29.23 kg/m  03/16/22 29.94 kg/m  02/14/22 30.29 kg/m     New complaints: None today  Allergies  Allergen Reactions   Amoxicillin Rash   Outpatient Encounter Medications as of 06/16/2022  Medication Sig   atorvastatin (LIPITOR) 40 MG tablet Take 1 tablet (40 mg total) by mouth daily.   blood glucose meter kit and supplies Dispense based on patient and insurance preference. Use up to four times daily as directed. (FOR ICD-10 E10.9, E11.9).   Continuous  Blood Gluc Receiver (FREESTYLE LIBRE 2 READER) DEVI 1 each by Does not apply route daily.   Continuous Blood Gluc Sensor (FREESTYLE LIBRE 14 DAY SENSOR) MISC USE AS DIRECTED DAILY FOR 14 DAYS. CHANGE DEVICE EVERY 14 DAYS.   Continuous Blood Gluc Sensor (FREESTYLE LIBRE 2 SENSOR) MISC 1 each by Does not apply route every 14 (fourteen) days.   glipiZIDE (GLUCOTROL) 10 MG tablet Take 2 po q am and 1 po qhs   olmesartan (BENICAR) 40 MG tablet Take 1 tablet (40 mg total) by mouth daily.   ONETOUCH DELICA LANCETS FINE MISC Test 1 x a day and prn  Dx E11.9   Semaglutide, 1 MG/DOSE, 4 MG/3ML SOPN Inject 1 mg as directed once a week.   sitaGLIPtin (JANUVIA) 100 MG tablet Take 1 tablet (100 mg total) by mouth daily.   No facility-administered encounter medications on file as of 06/16/2022.    Past Surgical History:  Procedure Laterality Date   BUNIONECTOMY     TONSILLECTOMY      Family History  Problem Relation Age of Onset   Diabetes Mother    Hypertension Mother    Heart disease Mother    Hyperlipidemia Father    Cancer Father        bladder   Emphysema Father       Controlled substance contract: n/a     Review of Systems  Constitutional:  Negative for diaphoresis.  Eyes:  Negative for pain.  Respiratory:  Negative for shortness of breath.   Cardiovascular:  Negative for chest pain, palpitations and leg swelling.  Gastrointestinal:  Negative for abdominal pain.  Endocrine: Negative for polydipsia.  Skin:  Negative for rash.  Neurological:  Negative for dizziness, weakness and headaches.  Hematological:  Does not bruise/bleed easily.  All other systems reviewed and are negative.      Objective:   Physical Exam Vitals and nursing note reviewed.  Constitutional:      General: She is not in acute distress.    Appearance: Normal appearance. She is well-developed.  HENT:     Head: Normocephalic.     Right Ear: Tympanic membrane normal.     Left Ear: Tympanic membrane  normal.     Nose: Nose normal.     Mouth/Throat:     Mouth: Mucous membranes are moist.  Eyes:     Pupils: Pupils are equal, round, and reactive to light.  Neck:     Vascular: No carotid bruit or JVD.  Cardiovascular:     Rate and Rhythm: Normal rate and regular rhythm.     Heart sounds: Normal heart sounds.  Pulmonary:     Effort: Pulmonary effort is normal. No respiratory distress.     Breath sounds: Normal breath sounds. No wheezing or rales.  Chest:     Chest wall: No tenderness.  Abdominal:     General: Bowel sounds are normal. There is no distension or abdominal bruit.     Palpations: Abdomen is soft. There is no hepatomegaly, splenomegaly, mass or pulsatile mass.     Tenderness: There is no abdominal tenderness.  Musculoskeletal:        General: Normal range of motion.     Cervical back: Normal range of motion and neck supple.  Lymphadenopathy:     Cervical: No cervical adenopathy.  Skin:    General: Skin is warm and dry.  Neurological:     Mental Status: She is alert and oriented to person, place, and time.     Deep Tendon Reflexes: Reflexes are normal and symmetric.  Psychiatric:        Behavior: Behavior normal.        Thought Content: Thought content normal.        Judgment: Judgment normal.    BP 136/88   Pulse 71   Temp 98.3 F (36.8 C) (Temporal)   Resp 20   Ht _0  (1.6 m)   Wt 165 lb (74.8 kg)   SpO2 97%   BMI 29.23 kg/m    Hgba1c 7.8%       Assessment & Plan:   Kara Cortez comes in today with chief complaint of No chief complaint on file.   Diagnosis and orders addressed:  1. Primary hypertension Low sodium diet - CBC with Differential/Platelet - CMP14+EGFR - olmesartan (BENICAR) 40 MG tablet; Take 1 tablet (40 mg total) by mouth daily.  Dispense: 90 tablet; Refill: 1  2. Mixed hyperlipidemia Low fat diet - Lipid panel - atorvastatin (LIPITOR) 40 MG tablet; Take 1 tablet (40 mg total) by mouth daily.  Dispense: 90 tablet;  Refill: 1  3. Type 2 diabetes mellitus without complication, without long-term current use of insulin (HCC) Continue to watch carbs in diet - Bayer DCA Hb A1c Waived - sitaGLIPtin (JANUVIA) 100 MG tablet; Take 1 tablet (100 mg total) by mouth daily.  Dispense: 90 tablet; Refill: 1 - glipiZIDE (  GLUCOTROL) 10 MG tablet; Take 2 po q am and 1 po qhs  Dispense: 270 tablet; Refill: 1 - Semaglutide, 1 MG/DOSE, 4 MG/3ML SOPN; Inject 1 mg as directed once a week.  Dispense: 3 mL; Refill: 3  4. Seasonal allergic rhinitis due to pollen OTC meds as needed  5. BMI 29.0-29.9,adult Discussed diet and exercise for person with BMI >25 Will recheck weight in 3-6 months    Labs pending Health Maintenance reviewed Diet and exercise encouraged  Follow up plan: 6 months   Mary-Margaret Hassell Done, FNP

## 2022-07-06 DIAGNOSIS — H524 Presbyopia: Secondary | ICD-10-CM | POA: Diagnosis not present

## 2022-07-06 DIAGNOSIS — E119 Type 2 diabetes mellitus without complications: Secondary | ICD-10-CM | POA: Diagnosis not present

## 2022-07-06 DIAGNOSIS — Z961 Presence of intraocular lens: Secondary | ICD-10-CM | POA: Diagnosis not present

## 2022-07-06 LAB — HM DIABETES EYE EXAM

## 2022-07-12 ENCOUNTER — Ambulatory Visit (INDEPENDENT_AMBULATORY_CARE_PROVIDER_SITE_OTHER): Payer: PPO | Admitting: Podiatry

## 2022-07-12 DIAGNOSIS — M7661 Achilles tendinitis, right leg: Secondary | ICD-10-CM

## 2022-07-12 NOTE — Progress Notes (Signed)
Subjective:  Patient ID: Kara Cortez, female    DOB: 1951/04/06,  MRN: [id number]  Chief Complaint  Patient presents with   achillies tendinitis     Pt stated that she is doing better     71 y.o. female presents with the above complaint.  Presents for follow-up of right Achilles pain.  She states she is doing a lot better.  No further pain.  She is ambulating with regular shoes.  She would like to discuss next treatment plan.   Review of Systems: Negative except as noted in the HPI. Denies N/V/F/Ch.  Past Medical History:  Diagnosis Date   Diabetes mellitus without complication (HCC)    Hypertension     Current Outpatient Medications:    atorvastatin (LIPITOR) 40 MG tablet, Take 1 tablet (40 mg total) by mouth daily., Disp: 90 tablet, Rfl: 1   blood glucose meter kit and supplies, Dispense based on patient and insurance preference. Use up to four times daily as directed. (FOR ICD-10 E10.9, E11.9)., Disp: 1 each, Rfl: 0   Continuous Blood Gluc Receiver (FREESTYLE LIBRE 2 READER) DEVI, 1 each by Does not apply route daily., Disp: 1 each, Rfl: 0   Continuous Blood Gluc Sensor (FREESTYLE LIBRE 14 DAY SENSOR) MISC, USE AS DIRECTED DAILY FOR 14 DAYS. CHANGE DEVICE EVERY 14 DAYS., Disp: 2 each, Rfl: 5   Continuous Blood Gluc Sensor (FREESTYLE LIBRE 2 SENSOR) MISC, 1 each by Does not apply route every 14 (fourteen) days., Disp: 6 each, Rfl: 1   glipiZIDE (GLUCOTROL) 10 MG tablet, Take 2 po q am and 1 po qhs, Disp: 270 tablet, Rfl: 1   olmesartan (BENICAR) 40 MG tablet, Take 1 tablet (40 mg total) by mouth daily., Disp: 90 tablet, Rfl: 1   ONETOUCH DELICA LANCETS FINE MISC, Test 1 x a day and prn  Dx E11.9, Disp: 100 each, Rfl: 3   Semaglutide, 1 MG/DOSE, 4 MG/3ML SOPN, Inject 1 mg as directed once a week., Disp: 3 mL, Rfl: 3   sitaGLIPtin (JANUVIA) 100 MG tablet, Take 1 tablet (100 mg total) by mouth daily., Disp: 90 tablet, Rfl: 1  Social History   Tobacco Use  Smoking Status  Never  Smokeless Tobacco Never    Allergies  Allergen Reactions   Amoxicillin Rash   Objective:  There were no vitals filed for this visit. There is no height or weight on file to calculate BMI. Constitutional Well developed. Well nourished.  Vascular Dorsalis pedis pulses palpable bilaterally. Posterior tibial pulses palpable bilaterally. Capillary refill normal to all digits.  No cyanosis or clubbing noted. Pedal hair growth normal.  Neurologic Normal speech. Oriented to person, place, and time. Epicritic sensation to light touch grossly present bilaterally.  Dermatologic Nails well groomed and normal in appearance. No open wounds. No skin lesions.  Orthopedic: No further pain on palpation of right Achilles tendon insertion.  No further pain with dorsiflexion of the ankle joint no pain with plantarflexion of the ankle joint positive Silfverskiold test with gastrocnemius equinus.  No other pain along the course of the Achilles tendon no pain at the posterior tibial tendon peroneal tendon ATFL ligament.  Haglund's deformity clinically appreciated   Radiographs: 3 views of skeletally mature the right foot: Haglund's deformity noted posterior heel spurring notedKager's triangle within normal limits.  No signs of acute tearing noted.  Tendinosis of the Achilles tendon noted midfoot arthritis noted. Assessment:   No diagnosis found.   Plan:  Patient was evaluated and treated and  all questions answered.  Right Achilles tendinitis -All questions and concerns were discussed with the patient in extensive detail -Clinic healed.  No further signs of Achilles insertional pain noted.  At this time I discussed shoe gear modification importance of orthotics.  She states understand will wear orthotics when she receives and breaks them in.  Pes planovalgus -I explained to patient the etiology of pes planovalgus and relationship with this Achilles tendinitis and various treatment options were  discussed.  Given patient foot structure in the setting of Achilles tendinitis I believe patient will benefit from custom-made orthotics to help control the hindfoot motion support the arch of the foot and take the stress away from plantar fascial.  Patient agrees with the plan like to proceed with orthotics -She is awaiting orthotics    No follow-ups on file.  Right Achilles tendinitis injection pes planovalgus orthotics

## 2022-07-15 ENCOUNTER — Telehealth: Payer: Self-pay | Admitting: Podiatry

## 2022-07-15 NOTE — Telephone Encounter (Signed)
Lvm for patient to call and schedule an appointment for orthotic pick up °

## 2022-08-16 ENCOUNTER — Ambulatory Visit (INDEPENDENT_AMBULATORY_CARE_PROVIDER_SITE_OTHER): Payer: Self-pay

## 2022-08-16 DIAGNOSIS — Q666 Other congenital valgus deformities of feet: Secondary | ICD-10-CM

## 2022-08-16 NOTE — Progress Notes (Signed)
Patient presents today to pick up custom molded foot orthotics, diagnosed with pes planovalgus by Dr. Posey Pronto.   Orthotics were dispensed and fit was satisfactory. Reviewed instructions for break-in and wear. Written instructions given to patient.  Patient will follow up as needed.   [facility] - order # [id number]

## 2022-08-28 ENCOUNTER — Other Ambulatory Visit: Payer: Self-pay | Admitting: Nurse Practitioner

## 2022-08-28 DIAGNOSIS — E782 Mixed hyperlipidemia: Secondary | ICD-10-CM

## 2022-09-01 ENCOUNTER — Telehealth: Payer: Self-pay | Admitting: *Deleted

## 2022-09-01 NOTE — Patient Outreach (Signed)
  Care Coordination   Initial Visit Note   09/01/2022 Name: Kara Cortez MRN: [id number] DOB: March 13, 1951  Kara Cortez is a 71 y.o. year old female who sees Kara Pretty, FNP for primary care. I spoke with  Kara Cortez by phone today.  What matters to the patients health and wellness today?  I DON'T NEED ANYTHING.   SDOH assessments and interventions completed:  No  Care Coordination Interventions:  No, not indicated   Follow up plan: No further intervention required.   Encounter Outcome:  Pt. Visit Completed   Kayleen Memos C. Myrtie Neither, MSN, [facility] Gerontological Nurse Practitioner [facility] Care Management [phone]

## 2022-09-21 ENCOUNTER — Ambulatory Visit (INDEPENDENT_AMBULATORY_CARE_PROVIDER_SITE_OTHER): Payer: PPO | Admitting: Nurse Practitioner

## 2022-09-21 ENCOUNTER — Encounter: Payer: Self-pay | Admitting: Nurse Practitioner

## 2022-09-21 VITALS — BP 144/84 | HR 73 | Temp 98.3°F | Resp 20 | Ht 63.0 in | Wt 161.0 lb

## 2022-09-21 DIAGNOSIS — E782 Mixed hyperlipidemia: Secondary | ICD-10-CM

## 2022-09-21 DIAGNOSIS — Z6829 Body mass index (BMI) 29.0-29.9, adult: Secondary | ICD-10-CM | POA: Diagnosis not present

## 2022-09-21 DIAGNOSIS — I10 Essential (primary) hypertension: Secondary | ICD-10-CM

## 2022-09-21 DIAGNOSIS — E119 Type 2 diabetes mellitus without complications: Secondary | ICD-10-CM | POA: Diagnosis not present

## 2022-09-21 LAB — BAYER DCA HB A1C WAIVED: HB A1C (BAYER DCA - WAIVED): 8.6 % — ABNORMAL HIGH (ref 4.8–5.6)

## 2022-09-21 MED ORDER — GLIPIZIDE 10 MG PO TABS: ORAL_TABLET | ORAL | 1 refills | Status: DC

## 2022-09-21 MED ORDER — SEMAGLUTIDE (1 MG/DOSE) 4 MG/3ML ~~LOC~~ SOPN: 1.0000 mg | PEN_INJECTOR | SUBCUTANEOUS | 3 refills | Status: DC

## 2022-09-21 MED ORDER — ATORVASTATIN CALCIUM 40 MG PO TABS: 40.0000 mg | ORAL_TABLET | Freq: Every day | ORAL | 1 refills | Status: DC

## 2022-09-21 MED ORDER — OLMESARTAN MEDOXOMIL 40 MG PO TABS: 40.0000 mg | ORAL_TABLET | Freq: Every day | ORAL | 1 refills | Status: DC

## 2022-09-21 NOTE — Patient Instructions (Signed)

## 2022-09-21 NOTE — Progress Notes (Signed)
Subjective:    Patient ID: Kara Cortez, female    DOB: Nov 25, 1950, 71 y.o.   MRN: [id number]   Chief Complaint: Medical Management of Chronic Issues    HPI:  Kara Cortez is a 71 y.o. who identifies as a female who was assigned female at birth.   Social history: Lives with: husband Work history: retired   Scientist, forensic in today for follow up of the following chronic medical issues:  1. Primary hypertension No c/o chest pain ,sob or headache. Does not check blood pressure at home. BP Readings from Last 3 Encounters:  09/21/22 (!) 144/84  06/16/22 136/88  03/16/22 139/88     2. Mixed hyperlipidemia Does try to watch diet and walks 2-3 x a week. Lab Results  Component Value Date   CHOL 164 06/16/2022   HDL 73 06/16/2022   LDLCALC 67 06/16/2022   TRIG 140 06/16/2022   CHOLHDL 2.2 06/16/2022     3. Type 2 diabetes mellitus without complication, without long-term current use of insulin (HCC) She has not been checking her blood sugars at home. Lab Results  Component Value Date   HGBA1C 7.8 (H) 06/16/2022     4. BMI 29.0-29.9,adult Weight is down 4 lbs Wt Readings from Last 3 Encounters:  09/21/22 161 lb (73 kg)  06/16/22 165 lb (74.8 kg)  03/16/22 169 lb (76.7 kg)   BMI Readings from Last 3 Encounters:  09/21/22 28.52 kg/m  06/16/22 29.23 kg/m  03/16/22 29.94 kg/m      New complaints: None today  Allergies  Allergen Reactions   Amoxicillin Rash   Outpatient Encounter Medications as of 09/21/2022  Medication Sig   atorvastatin (LIPITOR) 40 MG tablet TAKE 1 TABLET BY MOUTH EVERY DAY   olmesartan (BENICAR) 40 MG tablet Take 1 tablet (40 mg total) by mouth daily.   ONETOUCH DELICA LANCETS FINE MISC Test 1 x a day and prn  Dx E11.9   Semaglutide, 1 MG/DOSE, 4 MG/3ML SOPN Inject 1 mg as directed once a week.   Continuous Blood Gluc Receiver (FREESTYLE LIBRE 2 READER) DEVI 1 each by Does not apply route daily. (Patient not taking: Reported on  09/21/2022)   Continuous Blood Gluc Sensor (FREESTYLE LIBRE 14 DAY SENSOR) MISC USE AS DIRECTED DAILY FOR 14 DAYS. CHANGE DEVICE EVERY 14 DAYS. (Patient not taking: Reported on 09/21/2022)   glipiZIDE (GLUCOTROL) 10 MG tablet Take 2 po q am and 1 po qhs   [DISCONTINUED] blood glucose meter kit and supplies Dispense based on patient and insurance preference. Use up to four times daily as directed. (FOR ICD-10 E10.9, E11.9).   [DISCONTINUED] Continuous Blood Gluc Sensor (FREESTYLE LIBRE 2 SENSOR) MISC 1 each by Does not apply route every 14 (fourteen) days.   [DISCONTINUED] sitaGLIPtin (JANUVIA) 100 MG tablet Take 1 tablet (100 mg total) by mouth daily.   No facility-administered encounter medications on file as of 09/21/2022.    Past Surgical History:  Procedure Laterality Date   BUNIONECTOMY     TONSILLECTOMY      Family History  Problem Relation Age of Onset   Diabetes Mother    Hypertension Mother    Heart disease Mother    Hyperlipidemia Father    Cancer Father        bladder   Emphysema Father       Controlled substance contract: n/a     Review of Systems  Constitutional:  Negative for diaphoresis.  Eyes:  Negative for pain.  Respiratory:  Negative for shortness of breath.   Cardiovascular:  Negative for chest pain, palpitations and leg swelling.  Gastrointestinal:  Negative for abdominal pain.  Endocrine: Negative for polydipsia.  Skin:  Negative for rash.  Neurological:  Negative for dizziness, weakness and headaches.  Hematological:  Does not bruise/bleed easily.  All other systems reviewed and are negative.      Objective:   Physical Exam Vitals and nursing note reviewed.  Constitutional:      General: She is not in acute distress.    Appearance: Normal appearance. She is well-developed.  HENT:     Head: Normocephalic.     Right Ear: Tympanic membrane normal.     Left Ear: Tympanic membrane normal.     Nose: Nose normal.     Mouth/Throat:     Mouth:  Mucous membranes are moist.  Eyes:     Pupils: Pupils are equal, round, and reactive to light.  Neck:     Vascular: No carotid bruit or JVD.  Cardiovascular:     Rate and Rhythm: Normal rate and regular rhythm.     Heart sounds: Normal heart sounds.  Pulmonary:     Effort: Pulmonary effort is normal. No respiratory distress.     Breath sounds: Normal breath sounds. No wheezing or rales.  Chest:     Chest wall: No tenderness.  Abdominal:     General: Bowel sounds are normal. There is no distension or abdominal bruit.     Palpations: Abdomen is soft. There is no hepatomegaly, splenomegaly, mass or pulsatile mass.     Tenderness: There is no abdominal tenderness.  Musculoskeletal:        General: Normal range of motion.     Cervical back: Normal range of motion and neck supple.  Lymphadenopathy:     Cervical: No cervical adenopathy.  Skin:    General: Skin is warm and dry.  Neurological:     Mental Status: She is alert and oriented to person, place, and time.     Deep Tendon Reflexes: Reflexes are normal and symmetric.  Psychiatric:        Behavior: Behavior normal.        Thought Content: Thought content normal.        Judgment: Judgment normal.    BP (!) 144/84   Pulse 73   Temp 98.3 F (36.8 C) (Temporal)   Resp 20   Ht _0  (1.6 m)   Wt 161 lb (73 kg)   SpO2 97%   BMI 28.52 kg/m   Hgba1c 8.6%      Assessment & Plan:  Kara Cortez comes in today with chief complaint of Medical Management of Chronic Issues   Diagnosis and orders addressed:  1. Primary hypertension Low sodium diet - CBC with Differential/Platelet - CMP14+EGFR - olmesartan (BENICAR) 40 MG tablet; Take 1 tablet (40 mg total) by mouth daily.  Dispense: 90 tablet; Refill: 1  2. Mixed hyperlipidemia Low fat diet - Lipid panel - atorvastatin (LIPITOR) 40 MG tablet; Take 1 tablet (40 mg total) by mouth daily.  Dispense: 90 tablet; Refill: 1  3. Type 2 diabetes mellitus without  complication, without long-term current use of insulin ([place]) Get back on wearing libre- your hgba1c was good when you were wearingbthat Uf no bette rin 3 months will ned to add meds - Bayer DCA Hb A1c Waived - glipiZIDE (GLUCOTROL) 10 MG tablet; Take 2 po q am and 1 po qhs  Dispense: 270 tablet; Refill: 1  4. BMI 29.0-29.9,adult Discussed diet and exercise for person with BMI >25 Will recheck weight in 3-6 months    Labs pending Health Maintenance reviewed Diet and exercise encouraged  Follow up plan: 3 months   Mary-Margaret Hassell Done, FNP

## 2022-09-22 LAB — CBC WITH DIFFERENTIAL/PLATELET
Basophils Absolute: 0 10*3/uL (ref 0.0–0.2)
Basos: 1 %
EOS (ABSOLUTE): 0.3 10*3/uL (ref 0.0–0.4)
Eos: 5 %
Hematocrit: 43.1 % (ref 34.0–46.6)
Hemoglobin: 13.9 g/dL (ref 11.1–15.9)
Immature Grans (Abs): 0 10*3/uL (ref 0.0–0.1)
Immature Granulocytes: 0 %
Lymphocytes Absolute: 2 10*3/uL (ref 0.7–3.1)
Lymphs: 34 %
MCH: 27 pg (ref 26.6–33.0)
MCHC: 32.3 g/dL (ref 31.5–35.7)
MCV: 84 fL (ref 79–97)
Monocytes Absolute: 0.5 10*3/uL (ref 0.1–0.9)
Monocytes: 9 %
Neutrophils Absolute: 3 10*3/uL (ref 1.4–7.0)
Neutrophils: 51 %
Platelets: 216 10*3/uL (ref 150–450)
RBC: 5.15 x10E6/uL (ref 3.77–5.28)
RDW: 13.6 % (ref 11.7–15.4)
WBC: 5.8 10*3/uL (ref 3.4–10.8)

## 2022-09-22 LAB — CMP14+EGFR
ALT: 14 IU/L (ref 0–32)
AST: 13 IU/L (ref 0–40)
Albumin/Globulin Ratio: 2 (ref 1.2–2.2)
Albumin: 4.1 g/dL (ref 3.9–4.9)
Alkaline Phosphatase: 102 IU/L (ref 44–121)
BUN/Creatinine Ratio: 11 — ABNORMAL LOW (ref 12–28)
BUN: 9 mg/dL (ref 8–27)
Bilirubin Total: 0.3 mg/dL (ref 0.0–1.2)
CO2: 23 mmol/L (ref 20–29)
Calcium: 9.5 mg/dL (ref 8.7–10.3)
Chloride: 104 mmol/L (ref 96–106)
Creatinine, Ser: 0.84 mg/dL (ref 0.57–1.00)
Globulin, Total: 2.1 g/dL (ref 1.5–4.5)
Glucose: 129 mg/dL — ABNORMAL HIGH (ref 70–99)
Potassium: 4.5 mmol/L (ref 3.5–5.2)
Sodium: 141 mmol/L (ref 134–144)
Total Protein: 6.2 g/dL (ref 6.0–8.5)
eGFR: 75 mL/min/{1.73_m2} (ref >59)

## 2022-09-22 LAB — LIPID PANEL
Chol/HDL Ratio: 2 ratio (ref 0.0–4.4)
Cholesterol, Total: 135 mg/dL (ref 100–199)
HDL: 66 mg/dL (ref >39)
LDL Chol Calc (NIH): 50 mg/dL (ref 0–99)
Triglycerides: 103 mg/dL (ref 0–149)
VLDL Cholesterol Cal: 19 mg/dL (ref 5–40)

## 2022-10-16 ENCOUNTER — Encounter: Payer: Self-pay | Admitting: Nurse Practitioner

## 2022-10-29 NOTE — Progress Notes (Unsigned)
Subjective:   Kara Cortez is a 72 y.o. female who presents for an Initial Medicare Annual Wellness Visit.  Review of Systems    ***       Objective:    There were no vitals filed for this visit. There is no height or weight on file to calculate BMI.      No data to display          Current Medications (verified) Outpatient Encounter Medications as of 10/30/2022  Medication Sig   atorvastatin (LIPITOR) 40 MG tablet Take 1 tablet (40 mg total) by mouth daily.   Continuous Blood Gluc Receiver (FREESTYLE LIBRE 2 READER) DEVI 1 each by Does not apply route daily. (Patient not taking: Reported on 09/21/2022)   Continuous Blood Gluc Sensor (FREESTYLE LIBRE 14 DAY SENSOR) MISC USE AS DIRECTED DAILY FOR 14 DAYS. CHANGE DEVICE EVERY 14 DAYS. (Patient not taking: Reported on 09/21/2022)   glipiZIDE (GLUCOTROL) 10 MG tablet Take 2 po q am and 1 po qhs   olmesartan (BENICAR) 40 MG tablet Take 1 tablet (40 mg total) by mouth daily.   ONETOUCH DELICA LANCETS FINE MISC Test 1 x a day and prn  Dx E11.9   Semaglutide, 1 MG/DOSE, 4 MG/3ML SOPN Inject 1 mg as directed once a week.   No facility-administered encounter medications on file as of 10/30/2022.    Allergies (verified) Amoxicillin   History: Past Medical History:  Diagnosis Date   Diabetes mellitus without complication ([place])    Hypertension    Past Surgical History:  Procedure Laterality Date   BUNIONECTOMY     TONSILLECTOMY     Family History  Problem Relation Age of Onset   Diabetes Mother    Hypertension Mother    Heart disease Mother    Hyperlipidemia Father    Cancer Father        bladder   Emphysema Father    Social History   Socioeconomic History   Marital status: Married    Spouse name: Not on file   Number of children: Not on file   Years of education: Not on file   Highest education level: Not on file  Occupational History   Not on file  Tobacco Use   Smoking status: Never   Smokeless  tobacco: Never  Substance and Sexual Activity   Alcohol use: Yes    Comment: occasional   Drug use: No   Sexual activity: Not on file  Other Topics Concern   Not on file  Social History Narrative   Not on file   Social Determinants of Health   Financial Resource Strain: Not on file  Food Insecurity: Not on file  Transportation Needs: Not on file  Physical Activity: Not on file  Stress: Not on file  Social Connections: Not on file    Tobacco Counseling Counseling given: Not Answered   Clinical Intake:                 Diabetic?Yes  Nutrition Risk Assessment:  Has the patient had any N/V/D within the last 2 months?  {YES/NO:21197} Does the patient have any non-healing wounds?  {YES/NO:21197} Has the patient had any unintentional weight loss or weight gain?  {YES/NO:21197}  Diabetes:  Is the patient diabetic?  {YES/NO:21197} If diabetic, was a CBG obtained today?  {YES/NO:21197} Did the patient bring in their glucometer from home?  {YES/NO:21197} How often do you monitor your CBG's? ***.   Financial Strains and Diabetes Management:  Are  you having any financial strains with the device, your supplies or your medication? {YES/NO:21197}.  Does the patient want to be seen by Chronic Care Management for management of their diabetes?  {YES/NO:21197} Would the patient like to be referred to a Nutritionist or for Diabetic Management?  {YES/NO:21197}  Diabetic Exams:  Diabetic Eye Exam: Completed 07/06/22 Diabetic Foot Exam: Completed 09/21/22          Activities of Daily Living     No data to display          Patient Care Team: Chevis Pretty, FNP as PCP - General (Family Medicine) Lavonna Monarch, MD (Inactive) as Consulting Physician (Dermatology) Lavera Guise, [facility] as Pharmacist (Family Medicine)  Indicate any recent Medical Services you may have received from other than [facility] providers in the past year (date may be approximate).      Assessment:   This is a routine wellness examination for [place].  Hearing/Vision screen No results found.  Dietary issues and exercise activities discussed:     Goals Addressed   None    Depression Screen    09/21/2022    8:19 AM 06/16/2022    9:30 AM 03/16/2022    9:26 AM 01/09/2022   11:37 AM 12/06/2020   10:39 AM 09/07/2020   10:29 AM 06/04/2020    9:19 AM  PHQ 2/9 Scores  PHQ - 2 Score 0 0 0 0 0 0 0  PHQ- 9 Score 0 0 0 4       Fall Risk    09/21/2022    8:19 AM 06/16/2022    9:30 AM 03/16/2022    9:26 AM 01/09/2022   11:42 AM 01/09/2022   11:37 AM  Fall Risk   Falls in the past year? 0 0 0 1 1  Number falls in past yr:    0 0  Injury with Fall?    1 0  Risk for fall due to :    History of fall(s) History of fall(s)  Follow up    Education provided Education provided    FALL RISK PREVENTION PERTAINING TO THE HOME:  Any stairs in or around the home? {YES/NO:21197} If so, are there any without handrails? {YES/NO:21197} Home free of loose throw rugs in walkways, pet beds, electrical cords, etc? {YES/NO:21197} Adequate lighting in your home to reduce risk of falls? {YES/NO:21197}  ASSISTIVE DEVICES UTILIZED TO PREVENT FALLS:  Life alert? {YES/NO:21197} Use of a cane, walker or w/c? {YES/NO:21197} Grab bars in the bathroom? {YES/NO:21197} Shower chair or bench in shower? {YES/NO:21197} Elevated toilet seat or a handicapped toilet? {YES/NO:21197}  TIMED UP AND GO:  Was the test performed? No . Telephonic visit   Cognitive Function:        Immunizations Immunization History  Administered Date(s) Administered   Moderna Sars-Covid-2 Vaccination 11/26/2019, 12/24/2019, 08/21/2020   Pneumococcal Conjugate-13 02/25/2016   Pneumococcal Polysaccharide-23 04/06/2017   Tdap 07/25/2019   Zoster Recombinat (Shingrix) 12/06/2020, 01/09/2022    TDAP status: Up to date  {Flu Vaccine status:2101806}  Pneumococcal vaccine status: Up to date  Covid-19 vaccine  status: Information provided on how to obtain vaccines.   Qualifies for Shingles Vaccine? Yes   Zostavax completed No   Shingrix Completed?: Yes  Screening Tests Health Maintenance  Topic Date Due   Medicare Annual Wellness (AWV)  Never done   COVID-19 Vaccine (4 - 2023-24 season) 06/02/2022   INFLUENZA VACCINE  12/31/2022 (Originally 05/02/2022)   MAMMOGRAM  03/17/2023 (Originally 06/11/2020)   COLONOSCOPY (Pts  45-75yr Insurance coverage will need to be confirmed)  09/22/2023 (Originally 07/25/2022)   Diabetic kidney evaluation - Urine ACR  01/10/2023   HEMOGLOBIN A1C  03/23/2023   OPHTHALMOLOGY EXAM  07/07/2023   Diabetic kidney evaluation - eGFR measurement  09/22/2023   FOOT EXAM  09/22/2023   DTaP/Tdap/Td (2 - Td or Tdap) 07/24/2029   Pneumonia Vaccine 72 Years old  Completed   DEXA SCAN  Completed   Hepatitis C Screening  Completed   Zoster Vaccines- Shingrix  Completed   HPV VACCINES  Aged Out    Health Maintenance  Health Maintenance Due  Topic Date Due   Medicare Annual Wellness (AWV)  Never done   COVID-19 Vaccine (4 - 2023-24 season) 06/02/2022    {Colorectal cancer screening:2101809}  {Mammogram status:21018020}  {Bone Density status:21018021}  Lung Cancer Screening: (Low Dose CT Chest recommended if Age 71-80years, 30 pack-year currently smoking OR have quit w/in 15years.) does not qualify.   Lung Cancer Screening Referral: n/a   Additional Screening:  Hepatitis C Screening: does qualify; Completed 02/25/16  Vision Screening: Recommended annual ophthalmology exams for early detection of glaucoma and other disorders of the eye. Is the patient up to date with their annual eye exam?  {YES/NO:21197} Who is the provider or what is the name of the office in which the patient attends annual eye exams? *** If pt is not established with a provider, would they like to be referred to a provider to establish care? {YES/NO:21197}.   Dental Screening: Recommended  annual dental exams for proper oral hygiene  Community Resource Referral / Chronic Care Management: CRR required this visit?  {YES/NO:21197}  CCM required this visit?  {YES/NO:21197}     Plan:     I have personally reviewed and noted the following in the patient's chart:   Medical and social history Use of alcohol, tobacco or illicit drugs  Current medications and supplements including opioid prescriptions. {Opioid [phone]} Functional ability and status Nutritional status Physical activity Advanced directives List of other physicians Hospitalizations, surgeries, and ER visits in previous 12 months Vitals Screenings to include cognitive, depression, and falls Referrals and appointments  In addition, I have reviewed and discussed with patient certain preventive protocols, quality metrics, and best practice recommendations. A written personalized care plan for preventive services as well as general preventive health recommendations were provided to patient.     SVanetta Mulders [place]  15/11/7739  Due to this being a virtual visit, the after visit summary with patients personalized plan was offered to patient via mail or my-chart. ***Patient declined at this time./ Patient would like to access on my-chart/ per request, patient was mailed a copy of AVS./ Patient preferred to pick up at office at next visit   Nurse Notes: ***

## 2022-10-29 NOTE — Patient Instructions (Incomplete)
Kara Cortez , Thank you for taking time to come for your Medicare Wellness Visit. I appreciate your ongoing commitment to your health goals. Please review the following plan we discussed and let me know if I can assist you in the future.   These are the goals we discussed:  Goals   None     This is a list of the screening recommended for you and due dates:  Health Maintenance  Topic Date Due   Medicare Annual Wellness Visit  Never done   COVID-19 Vaccine (4 - 2023-24 season) 06/02/2022   Flu Shot  12/31/2022*   Mammogram  03/17/2023*   Colon Cancer Screening  09/22/2023*   Yearly kidney health urinalysis for diabetes  01/10/2023   Hemoglobin A1C  03/23/2023   Eye exam for diabetics  07/07/2023   Yearly kidney function blood test for diabetes  09/22/2023   Complete foot exam   09/22/2023   DTaP/Tdap/Td vaccine (2 - Td or Tdap) 07/24/2029   Pneumonia Vaccine  Completed   DEXA scan (bone density measurement)  Completed   Hepatitis C Screening: USPSTF Recommendation to screen - Ages 51-79 yo.  Completed   Zoster (Shingles) Vaccine  Completed   HPV Vaccine  Aged Out  *Topic was postponed. The date shown is not the original due date.    Advanced directives: ***  Conditions/risks identified: Aim for 30 minutes of exercise or brisk walking, 6-8 glasses of water, and 5 servings of fruits and vegetables each day.   Next appointment: Follow up in one year for your annual wellness visit    Preventive Care 65 Years and Older, Female Preventive care refers to lifestyle choices and visits with your health care provider that can promote health and wellness. What does preventive care include? A yearly physical exam. This is also called an annual well check. Dental exams once or twice a year. Routine eye exams. Ask your health care provider how often you should have your eyes checked. Personal lifestyle choices, including: Daily care of your teeth and gums. Regular physical  activity. Eating a healthy diet. Avoiding tobacco and drug use. Limiting alcohol use. Practicing safe sex. Taking low-dose aspirin every day. Taking vitamin and mineral supplements as recommended by your health care provider. What happens during an annual well check? The services and screenings done by your health care provider during your annual well check will depend on your age, overall health, lifestyle risk factors, and family history of disease. Counseling  Your health care provider may ask you questions about your: Alcohol use. Tobacco use. Drug use. Emotional well-being. Home and relationship well-being. Sexual activity. Eating habits. History of falls. Memory and ability to understand (cognition). Work and work Statistician. Reproductive health. Screening  You may have the following tests or measurements: Height, weight, and BMI. Blood pressure. Lipid and cholesterol levels. These may be checked every 5 years, or more frequently if you are over 1 years old. Skin check. Lung cancer screening. You may have this screening every year starting at age 72 if you have a 30-pack-year history of smoking and currently smoke or have quit within the past 15 years. Fecal occult blood test (FOBT) of the stool. You may have this test every year starting at age 50. Flexible sigmoidoscopy or colonoscopy. You may have a sigmoidoscopy every 5 years or a colonoscopy every 10 years starting at age 12. Hepatitis C blood test. Hepatitis B blood test. Sexually transmitted disease (STD) testing. Diabetes screening. This is done by checking  your blood sugar (glucose) after you have not eaten for a while (fasting). You may have this done every 1-3 years. Bone density scan. This is done to screen for osteoporosis. You may have this done starting at age 67. Mammogram. This may be done every 1-2 years. Talk to your health care provider about how often you should have regular mammograms. Talk with your  health care provider about your test results, treatment options, and if necessary, the need for more tests. Vaccines  Your health care provider may recommend certain vaccines, such as: Influenza vaccine. This is recommended every year. Tetanus, diphtheria, and acellular pertussis (Tdap, Td) vaccine. You may need a Td booster every 10 years. Zoster vaccine. You may need this after age 65. Pneumococcal 13-valent conjugate (PCV13) vaccine. One dose is recommended after age 41. Pneumococcal polysaccharide (PPSV23) vaccine. One dose is recommended after age 8. Talk to your health care provider about which screenings and vaccines you need and how often you need them. This information is not intended to replace advice given to you by your health care provider. Make sure you discuss any questions you have with your health care provider. Document Released: 10/15/2015 Document Revised: 06/07/2016 Document Reviewed: 07/20/2015 Elsevier Interactive Patient Education  2017 [employer] Prevention in the Home Falls can cause injuries. They can happen to people of all ages. There are many things you can do to make your home safe and to help prevent falls. What can I do on the outside of my home? Regularly fix the edges of walkways and driveways and fix any cracks. Remove anything that might make you trip as you walk through a door, such as a raised step or threshold. Trim any bushes or trees on the path to your home. Use bright outdoor lighting. Clear any walking paths of anything that might make someone trip, such as rocks or tools. Regularly check to see if handrails are loose or broken. Make sure that both sides of any steps have handrails. Any raised decks and porches should have guardrails on the edges. Have any leaves, snow, or ice cleared regularly. Use sand or salt on walking paths during winter. Clean up any spills in your garage right away. This includes oil or grease spills. What can I  do in the bathroom? Use night lights. Install grab bars by the toilet and in the tub and shower. Do not use towel bars as grab bars. Use non-skid mats or decals in the tub or shower. If you need to sit down in the shower, use a plastic, non-slip stool. Keep the floor dry. Clean up any water that spills on the floor as soon as it happens. Remove soap buildup in the tub or shower regularly. Attach bath mats securely with double-sided non-slip rug tape. Do not have throw rugs and other things on the floor that can make you trip. What can I do in the bedroom? Use night lights. Make sure that you have a light by your bed that is easy to reach. Do not use any sheets or blankets that are too big for your bed. They should not hang down onto the floor. Have a firm chair that has side arms. You can use this for support while you get dressed. Do not have throw rugs and other things on the floor that can make you trip. What can I do in the kitchen? Clean up any spills right away. Avoid walking on wet floors. Keep items that you use a lot  in easy-to-reach places. If you need to reach something above you, use a strong step stool that has a grab bar. Keep electrical cords out of the way. Do not use floor polish or wax that makes floors slippery. If you must use wax, use non-skid floor wax. Do not have throw rugs and other things on the floor that can make you trip. What can I do with my stairs? Do not leave any items on the stairs. Make sure that there are handrails on both sides of the stairs and use them. Fix handrails that are broken or loose. Make sure that handrails are as long as the stairways. Check any carpeting to make sure that it is firmly attached to the stairs. Fix any carpet that is loose or worn. Avoid having throw rugs at the top or bottom of the stairs. If you do have throw rugs, attach them to the floor with carpet tape. Make sure that you have a light switch at the top of the stairs  and the bottom of the stairs. If you do not have them, ask someone to add them for you. What else can I do to help prevent falls? Wear shoes that: Do not have high heels. Have rubber bottoms. Are comfortable and fit you well. Are closed at the toe. Do not wear sandals. If you use a stepladder: Make sure that it is fully opened. Do not climb a closed stepladder. Make sure that both sides of the stepladder are locked into place. Ask someone to hold it for you, if possible. Clearly mark and make sure that you can see: Any grab bars or handrails. First and last steps. Where the edge of each step is. Use tools that help you move around (mobility aids) if they are needed. These include: Canes. Walkers. Scooters. Crutches. Turn on the lights when you go into a dark area. Replace any light bulbs as soon as they burn out. Set up your furniture so you have a clear path. Avoid moving your furniture around. If any of your floors are uneven, fix them. If there are any pets around you, be aware of where they are. Review your medicines with your doctor. Some medicines can make you feel dizzy. This can increase your chance of falling. Ask your doctor what other things that you can do to help prevent falls. This information is not intended to replace advice given to you by your health care provider. Make sure you discuss any questions you have with your health care provider. Document Released: 07/15/2009 Document Revised: 02/24/2016 Document Reviewed: 10/23/2014 Elsevier Interactive Patient Education  2017 [employer].

## 2022-10-30 ENCOUNTER — Ambulatory Visit (INDEPENDENT_AMBULATORY_CARE_PROVIDER_SITE_OTHER): Payer: PPO

## 2022-10-30 VITALS — Ht 63.0 in | Wt 161.0 lb

## 2022-10-30 DIAGNOSIS — Z0000 Encounter for general adult medical examination without abnormal findings: Secondary | ICD-10-CM | POA: Diagnosis not present

## 2022-11-23 ENCOUNTER — Ambulatory Visit: Payer: PPO | Admitting: Nurse Practitioner

## 2022-11-28 ENCOUNTER — Encounter: Payer: Self-pay | Admitting: Nurse Practitioner

## 2022-11-28 ENCOUNTER — Ambulatory Visit (INDEPENDENT_AMBULATORY_CARE_PROVIDER_SITE_OTHER): Payer: PPO | Admitting: Nurse Practitioner

## 2022-11-28 VITALS — BP 141/86 | HR 74 | Temp 98.1°F | Resp 20 | Ht 63.0 in | Wt 163.0 lb

## 2022-11-28 DIAGNOSIS — E782 Mixed hyperlipidemia: Secondary | ICD-10-CM | POA: Diagnosis not present

## 2022-11-28 DIAGNOSIS — Z6829 Body mass index (BMI) 29.0-29.9, adult: Secondary | ICD-10-CM

## 2022-11-28 DIAGNOSIS — I10 Essential (primary) hypertension: Secondary | ICD-10-CM

## 2022-11-28 DIAGNOSIS — E119 Type 2 diabetes mellitus without complications: Secondary | ICD-10-CM | POA: Diagnosis not present

## 2022-11-28 DIAGNOSIS — Z0001 Encounter for general adult medical examination with abnormal findings: Secondary | ICD-10-CM | POA: Diagnosis not present

## 2022-11-28 DIAGNOSIS — Z0000 Encounter for general adult medical examination without abnormal findings: Secondary | ICD-10-CM

## 2022-11-28 LAB — LIPID PANEL

## 2022-11-28 LAB — BAYER DCA HB A1C WAIVED: HB A1C (BAYER DCA - WAIVED): 9 % — ABNORMAL HIGH (ref 4.8–5.6)

## 2022-11-28 MED ORDER — FREESTYLE LIBRE 14 DAY SENSOR MISC: 5 refills | Status: DC

## 2022-11-28 MED ORDER — GLIPIZIDE 10 MG PO TABS: ORAL_TABLET | ORAL | 1 refills | Status: DC

## 2022-11-28 MED ORDER — ATORVASTATIN CALCIUM 40 MG PO TABS: 40.0000 mg | ORAL_TABLET | Freq: Every day | ORAL | 1 refills | Status: DC

## 2022-11-28 MED ORDER — SEMAGLUTIDE (1 MG/DOSE) 4 MG/3ML ~~LOC~~ SOPN: 1.0000 mg | PEN_INJECTOR | SUBCUTANEOUS | 3 refills | Status: DC

## 2022-11-28 NOTE — Progress Notes (Signed)
Subjective:    Patient ID: Kara Cortez, female    DOB: Sep 07, 1951, 72 y.o.   MRN: [id number]   Chief Complaint: .annual physical   HPI:  Kara Cortez is a 72 y.o. who identifies as a female who was assigned female at birth.   Social history: Lives with: husband Work history: retired   Scientist, forensic in today for follow up of the following chronic medical issues:  1. Primary hypertension No c/o chest pain, sob or headache. Does not check blood pressure at home. BP Readings from Last 3 Encounters:  09/21/22 (!) 144/84  06/16/22 136/88  03/16/22 139/88     2. Mixed hyperlipidemia Does not really watch diet and does no dedicated exercise. She trys to stay active. Lab Results  Component Value Date   CHOL 135 09/21/2022   HDL 66 09/21/2022   LDLCALC 50 09/21/2022   TRIG 103 09/21/2022   CHOLHDL 2.0 09/21/2022      3. Type 2 diabetes mellitus without complication, without long-term current use of insulin ([place]) She has not ben checking blood sugars and has not been wearing libre.. She was encouraged to wear libre daily because she does better when she wears it. Stricter carb counting Lab Results  Component Value Date   HGBA1C 8.6 (H) 09/21/2022      4. BMI 29.0-29.9,adult No recent weight changes Wt Readings from Last 3 Encounters:  11/28/22 163 lb (73.9 kg)  10/30/22 161 lb (73 kg)  09/21/22 161 lb (73 kg)   BMI Readings from Last 3 Encounters:  11/28/22 28.87 kg/m  10/30/22 28.52 kg/m  09/21/22 28.52 kg/m     New complaints: Is in experimental study on how weight affects bone density. She has to do resistance training 3x a week and sees nutritionist weekly.  Allergies  Allergen Reactions   Amoxicillin Rash   Outpatient Encounter Medications as of 11/28/2022  Medication Sig   atorvastatin (LIPITOR) 40 MG tablet Take 1 tablet (40 mg total) by mouth daily.   Continuous Blood Gluc Receiver (FREESTYLE LIBRE 2 READER) DEVI 1 each by Does not  apply route daily. (Patient not taking: Reported on 10/30/2022)   Continuous Blood Gluc Sensor (FREESTYLE LIBRE 14 DAY SENSOR) MISC USE AS DIRECTED DAILY FOR 14 DAYS. CHANGE DEVICE EVERY 14 DAYS. (Patient not taking: Reported on 10/30/2022)   glipiZIDE (GLUCOTROL) 10 MG tablet Take 2 po q am and 1 po qhs   olmesartan (BENICAR) 40 MG tablet Take 1 tablet (40 mg total) by mouth daily.   ONETOUCH DELICA LANCETS FINE MISC Test 1 x a day and prn  Dx E11.9   Semaglutide, 1 MG/DOSE, 4 MG/3ML SOPN Inject 1 mg as directed once a week.   No facility-administered encounter medications on file as of 11/28/2022.    Past Surgical History:  Procedure Laterality Date   BUNIONECTOMY     TONSILLECTOMY      Family History  Problem Relation Age of Onset   Diabetes Mother    Hypertension Mother    Heart disease Mother    Hyperlipidemia Father    Cancer Father        bladder   Emphysema Father       Controlled substance contract: n/a     Review of Systems  Constitutional:  Negative for diaphoresis.  Eyes:  Negative for pain.  Respiratory:  Negative for shortness of breath.   Cardiovascular:  Negative for chest pain, palpitations and leg swelling.  Gastrointestinal:  Negative for abdominal pain.  Endocrine: Negative for polydipsia.  Skin:  Negative for rash.  Neurological:  Negative for dizziness, weakness and headaches.  Hematological:  Does not bruise/bleed easily.  All other systems reviewed and are negative.      Objective:   Physical Exam Vitals and nursing note reviewed.  Constitutional:      General: She is not in acute distress.    Appearance: Normal appearance. She is well-developed.  HENT:     Head: Normocephalic.     Right Ear: Tympanic membrane normal.     Left Ear: Tympanic membrane normal.     Nose: Nose normal.     Mouth/Throat:     Mouth: Mucous membranes are moist.  Eyes:     Pupils: Pupils are equal, round, and reactive to light.  Neck:     Vascular: No carotid  bruit or JVD.  Cardiovascular:     Rate and Rhythm: Normal rate and regular rhythm.     Heart sounds: Normal heart sounds.  Pulmonary:     Effort: Pulmonary effort is normal. No respiratory distress.     Breath sounds: Normal breath sounds. No wheezing or rales.  Chest:     Chest wall: No tenderness.  Abdominal:     General: Bowel sounds are normal. There is no distension or abdominal bruit.     Palpations: Abdomen is soft. There is no hepatomegaly, splenomegaly, mass or pulsatile mass.     Tenderness: There is no abdominal tenderness.  Musculoskeletal:        General: Normal range of motion.     Cervical back: Normal range of motion and neck supple.  Lymphadenopathy:     Cervical: No cervical adenopathy.  Skin:    General: Skin is warm and dry.  Neurological:     Mental Status: She is alert and oriented to person, place, and time.     Deep Tendon Reflexes: Reflexes are normal and symmetric.  Psychiatric:        Behavior: Behavior normal.        Thought Content: Thought content normal.        Judgment: Judgment normal.     BP (!) 141/86   Pulse 74   Temp 98.1 F (36.7 C) (Temporal)   Resp 20   Ht '5\' 3"'$  (1.6 m)   Wt 163 lb (73.9 kg)   SpO2 96%   BMI 28.87 kg/m   HGBA1c 9.0%      Assessment & Plan:   Kara Cortez comes in today with chief complaint of Annual Exam   Diagnosis and orders addressed:  1. Annual physical exam  2. Primary hypertension Low sodium diet - CBC with Differential/Platelet - CMP14+EGFR  3. Mixed hyperlipidemia Low fat diet - Lipid panel - atorvastatin (LIPITOR) 40 MG tablet; Take 1 tablet (40 mg total) by mouth daily.  Dispense: 90 tablet; Refill: 1  4. Type 2 diabetes mellitus without complication, without long-term current use of insulin (HCC) Stricter carb counting Does not want to make changes to meds- wants to wait and see if this study will help with hgab1c - Bayer DCA Hb A1c Waived - Continuous Blood Gluc Sensor  (FREESTYLE LIBRE 14 DAY SENSOR) MISC; USE AS DIRECTED DAILY FOR 14 DAYS. CHANGE DEVICE EVERY 14 DAYS.  Dispense: 2 each; Refill: 5 - glipiZIDE (GLUCOTROL) 10 MG tablet; Take 2 po q am and 1 po qhs  Dispense: 270 tablet; Refill: 1 - Semaglutide, 1 MG/DOSE, 4 MG/3ML SOPN; Inject 1 mg as directed once a  week.  Dispense: 3 mL; Refill: 3  5. BMI 29.0-29.9,adult Discussed diet and exercise for person with BMI >25 Will recheck weight in 3-6 months  Labs pending Health Maintenance reviewed Diet and exercise encouraged  Follow up plan: 3 months   Mary-Margaret Hassell Done, FNP

## 2022-11-28 NOTE — Patient Instructions (Signed)

## 2022-11-29 LAB — LIPID PANEL
Chol/HDL Ratio: 2 ratio (ref 0.0–4.4)
Cholesterol, Total: 142 mg/dL (ref 100–199)
HDL: 72 mg/dL (ref >39)
LDL Chol Calc (NIH): 49 mg/dL (ref 0–99)
Triglycerides: 118 mg/dL (ref 0–149)
VLDL Cholesterol Cal: 21 mg/dL (ref 5–40)

## 2022-11-29 LAB — CBC WITH DIFFERENTIAL/PLATELET
Basophils Absolute: 0 10*3/uL (ref 0.0–0.2)
Basos: 1 %
EOS (ABSOLUTE): 0.2 10*3/uL (ref 0.0–0.4)
Eos: 2 %
Hematocrit: 44.6 % (ref 34.0–46.6)
Hemoglobin: 14.7 g/dL (ref 11.1–15.9)
Immature Grans (Abs): 0 10*3/uL (ref 0.0–0.1)
Immature Granulocytes: 0 %
Lymphocytes Absolute: 2.3 10*3/uL (ref 0.7–3.1)
Lymphs: 29 %
MCH: 27.6 pg (ref 26.6–33.0)
MCHC: 33 g/dL (ref 31.5–35.7)
MCV: 84 fL (ref 79–97)
Monocytes Absolute: 0.4 10*3/uL (ref 0.1–0.9)
Monocytes: 5 %
Neutrophils Absolute: 4.9 10*3/uL (ref 1.4–7.0)
Neutrophils: 63 %
Platelets: 248 10*3/uL (ref 150–450)
RBC: 5.32 x10E6/uL — ABNORMAL HIGH (ref 3.77–5.28)
RDW: 13.6 % (ref 11.7–15.4)
WBC: 7.9 10*3/uL (ref 3.4–10.8)

## 2022-11-29 LAB — CMP14+EGFR
ALT: 17 IU/L (ref 0–32)
AST: 16 IU/L (ref 0–40)
Albumin/Globulin Ratio: 1.8 (ref 1.2–2.2)
Albumin: 4.2 g/dL (ref 3.9–4.9)
Alkaline Phosphatase: 107 IU/L (ref 44–121)
BUN/Creatinine Ratio: 12 (ref 12–28)
BUN: 11 mg/dL (ref 8–27)
Bilirubin Total: 0.3 mg/dL (ref 0.0–1.2)
CO2: 23 mmol/L (ref 20–29)
Calcium: 9.6 mg/dL (ref 8.7–10.3)
Chloride: 103 mmol/L (ref 96–106)
Creatinine, Ser: 0.91 mg/dL (ref 0.57–1.00)
Globulin, Total: 2.3 g/dL (ref 1.5–4.5)
Glucose: 126 mg/dL — ABNORMAL HIGH (ref 70–99)
Potassium: 4.5 mmol/L (ref 3.5–5.2)
Sodium: 142 mmol/L (ref 134–144)
Total Protein: 6.5 g/dL (ref 6.0–8.5)
eGFR: 68 mL/min/{1.73_m2} (ref >59)

## 2023-03-19 ENCOUNTER — Encounter: Payer: Self-pay | Admitting: Nurse Practitioner

## 2023-03-19 ENCOUNTER — Ambulatory Visit (INDEPENDENT_AMBULATORY_CARE_PROVIDER_SITE_OTHER): Payer: PPO | Admitting: Nurse Practitioner

## 2023-03-19 VITALS — BP 131/81 | HR 73 | Temp 97.8°F | Resp 20 | Ht 63.0 in | Wt 161.0 lb

## 2023-03-19 DIAGNOSIS — E1169 Type 2 diabetes mellitus with other specified complication: Secondary | ICD-10-CM | POA: Diagnosis not present

## 2023-03-19 DIAGNOSIS — E119 Type 2 diabetes mellitus without complications: Secondary | ICD-10-CM

## 2023-03-19 DIAGNOSIS — E785 Hyperlipidemia, unspecified: Secondary | ICD-10-CM | POA: Diagnosis not present

## 2023-03-19 DIAGNOSIS — Z7985 Long-term (current) use of injectable non-insulin antidiabetic drugs: Secondary | ICD-10-CM | POA: Insufficient documentation

## 2023-03-19 DIAGNOSIS — E782 Mixed hyperlipidemia: Secondary | ICD-10-CM

## 2023-03-19 DIAGNOSIS — I10 Essential (primary) hypertension: Secondary | ICD-10-CM

## 2023-03-19 DIAGNOSIS — Z6828 Body mass index (BMI) 28.0-28.9, adult: Secondary | ICD-10-CM | POA: Diagnosis not present

## 2023-03-19 DIAGNOSIS — Z6829 Body mass index (BMI) 29.0-29.9, adult: Secondary | ICD-10-CM

## 2023-03-19 LAB — CMP14+EGFR
ALT: 14 IU/L (ref 0–32)
AST: 15 IU/L (ref 0–40)
Albumin: 4.3 g/dL (ref 3.9–4.9)
Alkaline Phosphatase: 119 IU/L (ref 44–121)
BUN/Creatinine Ratio: 22 (ref 12–28)
BUN: 19 mg/dL (ref 8–27)
Bilirubin Total: 0.3 mg/dL (ref 0.0–1.2)
CO2: 24 mmol/L (ref 20–29)
Calcium: 9.2 mg/dL (ref 8.7–10.3)
Chloride: 100 mmol/L (ref 96–106)
Creatinine, Ser: 0.88 mg/dL (ref 0.57–1.00)
Globulin, Total: 2.2 g/dL (ref 1.5–4.5)
Glucose: 148 mg/dL — ABNORMAL HIGH (ref 70–99)
Potassium: 4.5 mmol/L (ref 3.5–5.2)
Sodium: 137 mmol/L (ref 134–144)
Total Protein: 6.5 g/dL (ref 6.0–8.5)
eGFR: 71 mL/min/{1.73_m2} (ref >59)

## 2023-03-19 LAB — CBC WITH DIFFERENTIAL/PLATELET
Basophils Absolute: 0.1 10*3/uL (ref 0.0–0.2)
Basos: 1 %
EOS (ABSOLUTE): 0.2 10*3/uL (ref 0.0–0.4)
Eos: 2 %
Hematocrit: 44 % (ref 34.0–46.6)
Hemoglobin: 13.9 g/dL (ref 11.1–15.9)
Immature Grans (Abs): 0 10*3/uL (ref 0.0–0.1)
Immature Granulocytes: 0 %
Lymphocytes Absolute: 2.4 10*3/uL (ref 0.7–3.1)
Lymphs: 30 %
MCH: 27 pg (ref 26.6–33.0)
MCHC: 31.6 g/dL (ref 31.5–35.7)
MCV: 86 fL (ref 79–97)
Monocytes Absolute: 0.4 10*3/uL (ref 0.1–0.9)
Monocytes: 6 %
Neutrophils Absolute: 4.9 10*3/uL (ref 1.4–7.0)
Neutrophils: 61 %
Platelets: 219 10*3/uL (ref 150–450)
RBC: 5.14 x10E6/uL (ref 3.77–5.28)
RDW: 14 % (ref 11.7–15.4)
WBC: 8 10*3/uL (ref 3.4–10.8)

## 2023-03-19 LAB — LIPID PANEL
Chol/HDL Ratio: 2 ratio (ref 0.0–4.4)
Cholesterol, Total: 139 mg/dL (ref 100–199)
HDL: 68 mg/dL (ref >39)
LDL Chol Calc (NIH): 54 mg/dL (ref 0–99)
Triglycerides: 88 mg/dL (ref 0–149)
VLDL Cholesterol Cal: 17 mg/dL (ref 5–40)

## 2023-03-19 LAB — BAYER DCA HB A1C WAIVED: HB A1C (BAYER DCA - WAIVED): 6.9 % — ABNORMAL HIGH (ref 4.8–5.6)

## 2023-03-19 MED ORDER — OLMESARTAN MEDOXOMIL 40 MG PO TABS
40.0000 mg | ORAL_TABLET | Freq: Every day | ORAL | 1 refills | Status: DC
Start: 2023-03-19 — End: 2023-09-17

## 2023-03-19 MED ORDER — GLIPIZIDE 10 MG PO TABS
ORAL_TABLET | ORAL | 1 refills | Status: AC
Start: 2023-03-19 — End: ?

## 2023-03-19 MED ORDER — ATORVASTATIN CALCIUM 40 MG PO TABS: 40.0000 mg | ORAL_TABLET | Freq: Every day | ORAL | 1 refills | Status: DC

## 2023-03-19 MED ORDER — SEMAGLUTIDE (2 MG/DOSE) 8 MG/3ML ~~LOC~~ SOPN
2.0000 mg | PEN_INJECTOR | SUBCUTANEOUS | 3 refills | Status: DC
Start: 2023-03-19 — End: 2023-07-30

## 2023-03-19 NOTE — Progress Notes (Signed)
Subjective:    Patient ID: Kara Cortez, female    DOB: Oct 21, 1950, 72 y.o.   MRN: [id number]   Chief Complaint: No chief complaint on file.    HPI:  Kara Cortez is a 72 y.o. who identifies as a female who was assigned female at birth.   Social history: Lives with: husband Work history: retired   Water engineer in today for follow up of the following chronic medical issues:  1. Primary hypertension No c/o chest pain, sob or headache. Does not check blood pressure at home BP Readings from Last 3 Encounters:  11/28/22 (!) 141/86  09/21/22 (!) 144/84  06/16/22 136/88     2. Hyperlipidemia associated with type 2 diabetes mellitus (HCC) Does try to watch diet but does no dedicated exercise. Lab Results  Component Value Date   CHOL 142 11/28/2022   HDL 72 11/28/2022   LDLCALC 49 11/28/2022   TRIG 118 11/28/2022   CHOLHDL 2.0 11/28/2022     3. Type 2 diabetes mellitus without complication, without long-term current use of insulin (HCC) Does not check blood sugars every day at home. Refused medication change at last visit.  Lab Results  Component Value Date   HGBA1C 9.0 (H) 11/28/2022     4. Long-term current use of injectable noninsulin antidiabetic medication Is on ozempic with no issues  5. BMI 29.0-29.9,adult No recent weight changes Wt Readings from Last 3 Encounters:  03/19/23 161 lb (73 kg)  11/28/22 163 lb (73.9 kg)  10/30/22 161 lb (73 kg)   BMI Readings from Last 3 Encounters:  03/19/23 28.52 kg/m  11/28/22 28.87 kg/m  10/30/22 28.52 kg/m     New complaints: None today  Allergies  Allergen Reactions   Amoxicillin Rash   Outpatient Encounter Medications as of 03/19/2023  Medication Sig   atorvastatin (LIPITOR) 40 MG tablet Take 1 tablet (40 mg total) by mouth daily.   Continuous Blood Gluc Receiver (FREESTYLE LIBRE 2 READER) DEVI 1 each by Does not apply route daily.   Continuous Blood Gluc Sensor (FREESTYLE LIBRE 14 DAY SENSOR)  MISC USE AS DIRECTED DAILY FOR 14 DAYS. CHANGE DEVICE EVERY 14 DAYS.   glipiZIDE (GLUCOTROL) 10 MG tablet Take 2 po q am and 1 po qhs   olmesartan (BENICAR) 40 MG tablet Take 1 tablet (40 mg total) by mouth daily.   ONETOUCH DELICA LANCETS FINE MISC Test 1 x a day and prn  Dx E11.9   Semaglutide, 1 MG/DOSE, 4 MG/3ML SOPN Inject 1 mg as directed once a week.   No facility-administered encounter medications on file as of 03/19/2023.    Past Surgical History:  Procedure Laterality Date   BUNIONECTOMY     TONSILLECTOMY      Family History  Problem Relation Age of Onset   Diabetes Mother    Hypertension Mother    Heart disease Mother    Hyperlipidemia Father    Cancer Father        bladder   Emphysema Father       Controlled substance contract: n/a     Review of Systems  Constitutional:  Negative for diaphoresis.  Eyes:  Negative for pain.  Respiratory:  Negative for shortness of breath.   Cardiovascular:  Negative for chest pain, palpitations and leg swelling.  Gastrointestinal:  Negative for abdominal pain.  Endocrine: Negative for polydipsia.  Skin:  Negative for rash.  Neurological:  Negative for dizziness, weakness and headaches.  Hematological:  Does not bruise/bleed easily.  All other systems reviewed and are negative.      Objective:   Physical Exam Vitals and nursing note reviewed.  Constitutional:      General: She is not in acute distress.    Appearance: Normal appearance. She is well-developed.  HENT:     Head: Normocephalic.     Right Ear: Tympanic membrane normal.     Left Ear: Tympanic membrane normal.     Nose: Nose normal.     Mouth/Throat:     Mouth: Mucous membranes are moist.  Eyes:     Pupils: Pupils are equal, round, and reactive to light.  Neck:     Vascular: No carotid bruit or JVD.  Cardiovascular:     Rate and Rhythm: Normal rate and regular rhythm.     Heart sounds: Normal heart sounds.  Pulmonary:     Effort: Pulmonary effort  is normal. No respiratory distress.     Breath sounds: Normal breath sounds. No wheezing or rales.  Chest:     Chest wall: No tenderness.  Abdominal:     General: Bowel sounds are normal. There is no distension or abdominal bruit.     Palpations: Abdomen is soft. There is no hepatomegaly, splenomegaly, mass or pulsatile mass.     Tenderness: There is no abdominal tenderness.  Musculoskeletal:        General: Normal range of motion.     Cervical back: Normal range of motion and neck supple.  Lymphadenopathy:     Cervical: No cervical adenopathy.  Skin:    General: Skin is warm and dry.  Neurological:     Mental Status: She is alert and oriented to person, place, and time.     Deep Tendon Reflexes: Reflexes are normal and symmetric.  Psychiatric:        Behavior: Behavior normal.        Thought Content: Thought content normal.        Judgment: Judgment normal.    BP 131/81   Pulse 73   Temp 97.8 F (36.6 C) (Temporal)   Resp 20   Ht 5\' 3"  (1.6 m)   Wt 161 lb (73 kg)   SpO2 97%   BMI 28.52 kg/m   Hgba1c 6.9%      Assessment & Plan:   Kara Cortez comes in today with chief complaint of Medical Management of Chronic Issues   Diagnosis and orders addressed:  1. Primary hypertension Low sodium diet - CBC with Differential/Platelet - CMP14+EGFR - olmesartan (BENICAR) 40 MG tablet; Take 1 tablet (40 mg total) by mouth daily.  Dispense: 90 tablet; Refill: 1  2. Hyperlipidemia associated with type 2 diabetes mellitus (HCC) - atorvastatin (LIPITOR) 40 MG tablet; Take 1 tablet (40 mg total) by mouth daily.  Dispense: 90 tablet; Refill: 1 - Lipid panel  3. Type 2 diabetes mellitus without complication, without long-term current use of insulin (HCC) Carb counting encouraged - Bayer DCA Hb A1c Waived - Microalbumin / creatinine urine ratio - glipiZIDE (GLUCOTROL) 10 MG tablet; Take 2 po q am and 1 po qhs  Dispense: 270 tablet; Refill: 1 - Semaglutide, 2 MG/DOSE, 8  MG/3ML SOPN; Inject 2 mg as directed once a week.  Dispense: 3 mL; Refill: 3  4. Long-term current use of injectable noninsulin antidiabetic medication Increase ozempic to 2mg  weekly  5. BMI 29.0-29.9,adult Discussed diet and exercise for person with BMI >25 Will recheck weight in 3-6 months    Labs pending Health Maintenance reviewed Diet  and exercise encouraged  Follow up plan: 3 months   Mary-Margaret Hassell Done, FNP

## 2023-03-19 NOTE — Patient Instructions (Signed)

## 2023-04-02 ENCOUNTER — Other Ambulatory Visit: Payer: Self-pay

## 2023-04-02 DIAGNOSIS — Z780 Asymptomatic menopausal state: Secondary | ICD-10-CM

## 2023-04-25 ENCOUNTER — Encounter: Payer: Self-pay | Admitting: Nurse Practitioner

## 2023-06-20 NOTE — Patient Instructions (Signed)

## 2023-06-21 ENCOUNTER — Ambulatory Visit: Payer: PPO | Admitting: Nurse Practitioner

## 2023-06-25 ENCOUNTER — Ambulatory Visit (INDEPENDENT_AMBULATORY_CARE_PROVIDER_SITE_OTHER): Payer: PPO

## 2023-06-25 ENCOUNTER — Ambulatory Visit (INDEPENDENT_AMBULATORY_CARE_PROVIDER_SITE_OTHER): Payer: PPO | Admitting: Nurse Practitioner

## 2023-06-25 ENCOUNTER — Encounter: Payer: Self-pay | Admitting: Nurse Practitioner

## 2023-06-25 VITALS — BP 142/86 | HR 65 | Temp 97.9°F | Resp 20 | Ht 63.0 in | Wt 160.0 lb

## 2023-06-25 DIAGNOSIS — E119 Type 2 diabetes mellitus without complications: Secondary | ICD-10-CM | POA: Diagnosis not present

## 2023-06-25 DIAGNOSIS — Z7984 Long term (current) use of oral hypoglycemic drugs: Secondary | ICD-10-CM

## 2023-06-25 DIAGNOSIS — E1169 Type 2 diabetes mellitus with other specified complication: Secondary | ICD-10-CM

## 2023-06-25 DIAGNOSIS — E785 Hyperlipidemia, unspecified: Secondary | ICD-10-CM | POA: Diagnosis not present

## 2023-06-25 DIAGNOSIS — Z780 Asymptomatic menopausal state: Secondary | ICD-10-CM

## 2023-06-25 DIAGNOSIS — I10 Essential (primary) hypertension: Secondary | ICD-10-CM

## 2023-06-25 DIAGNOSIS — Z6829 Body mass index (BMI) 29.0-29.9, adult: Secondary | ICD-10-CM | POA: Diagnosis not present

## 2023-06-25 DIAGNOSIS — Z7985 Long-term (current) use of injectable non-insulin antidiabetic drugs: Secondary | ICD-10-CM | POA: Diagnosis not present

## 2023-06-25 LAB — CMP14+EGFR
ALT: 14 IU/L (ref 0–32)
AST: 15 IU/L (ref 0–40)
Albumin: 4.2 g/dL (ref 3.8–4.8)
Alkaline Phosphatase: 94 IU/L (ref 44–121)
BUN/Creatinine Ratio: 17 (ref 12–28)
BUN: 16 mg/dL (ref 8–27)
Bilirubin Total: 0.3 mg/dL (ref 0.0–1.2)
CO2: 25 mmol/L (ref 20–29)
Calcium: 9.4 mg/dL (ref 8.7–10.3)
Chloride: 103 mmol/L (ref 96–106)
Creatinine, Ser: 0.95 mg/dL (ref 0.57–1.00)
Globulin, Total: 2.3 g/dL (ref 1.5–4.5)
Glucose: 80 mg/dL (ref 70–99)
Potassium: 4.6 mmol/L (ref 3.5–5.2)
Sodium: 140 mmol/L (ref 134–144)
Total Protein: 6.5 g/dL (ref 6.0–8.5)
eGFR: 64 mL/min/{1.73_m2} (ref >59)

## 2023-06-25 LAB — LIPID PANEL
Chol/HDL Ratio: 1.9 ratio (ref 0.0–4.4)
Cholesterol, Total: 141 mg/dL (ref 100–199)
HDL: 73 mg/dL (ref >39)
LDL Chol Calc (NIH): 51 mg/dL (ref 0–99)
Triglycerides: 94 mg/dL (ref 0–149)
VLDL Cholesterol Cal: 17 mg/dL (ref 5–40)

## 2023-06-25 LAB — CBC WITH DIFFERENTIAL/PLATELET
Basophils Absolute: 0.1 10*3/uL (ref 0.0–0.2)
Basos: 1 %
EOS (ABSOLUTE): 0.2 10*3/uL (ref 0.0–0.4)
Eos: 3 %
Hematocrit: 43.1 % (ref 34.0–46.6)
Hemoglobin: 13.8 g/dL (ref 11.1–15.9)
Immature Grans (Abs): 0 10*3/uL (ref 0.0–0.1)
Immature Granulocytes: 0 %
Lymphocytes Absolute: 2.5 10*3/uL (ref 0.7–3.1)
Lymphs: 33 %
MCH: 27.8 pg (ref 26.6–33.0)
MCHC: 32 g/dL (ref 31.5–35.7)
MCV: 87 fL (ref 79–97)
Monocytes Absolute: 0.4 10*3/uL (ref 0.1–0.9)
Monocytes: 6 %
Neutrophils Absolute: 4.4 10*3/uL (ref 1.4–7.0)
Neutrophils: 57 %
Platelets: 244 10*3/uL (ref 150–450)
RBC: 4.96 x10E6/uL (ref 3.77–5.28)
RDW: 14.2 % (ref 11.7–15.4)
WBC: 7.6 10*3/uL (ref 3.4–10.8)

## 2023-06-25 LAB — BAYER DCA HB A1C WAIVED: HB A1C (BAYER DCA - WAIVED): 6.9 % — ABNORMAL HIGH (ref 4.8–5.6)

## 2023-06-25 NOTE — Progress Notes (Signed)
Subjective:    Patient ID: Kara Cortez, female    DOB: 17-Aug-1951, 72 y.o.   MRN: [id number]   Chief Complaint: medical management of chronic issues    HPI:  Kara Cortez is a 72 y.o. who identifies as a female who was assigned female at birth.   Social history: Lives with: husband Work history: retired   Water engineer in today for follow up of the following chronic medical issues:  1. Primary hypertension No c/o chest pain, sob or headache. Does not check blood pressure at home. BP Readings from Last 3 Encounters:  03/19/23 131/81  11/28/22 (!) 141/86  09/21/22 (!) 144/84     2. Diabetes mellitus treated with oral medication (HCC) She does not check blood sugars at home. Lab Results  Component Value Date   HGBA1C 6.9 (H) 03/19/2023     3. Long-term current use of injectable noninsulin antidiabetic medication Is on ozempic weekly  4. Hyperlipidemia associated with type 2 diabetes mellitus (HCC) Is on lipitor and is doing well. She does try to wtahc diet but no dedictaed exercise. Lab Results  Component Value Date   CHOL 139 03/19/2023   HDL 68 03/19/2023   LDLCALC 54 03/19/2023   TRIG 88 03/19/2023   CHOLHDL 2.0 03/19/2023     5. BMI 29.0-29.9,adult No recent weight changes Wt Readings from Last 3 Encounters:  06/25/23 160 lb (72.6 kg)  03/19/23 161 lb (73 kg)  11/28/22 163 lb (73.9 kg)   BMI Readings from Last 3 Encounters:  06/25/23 28.34 kg/m  03/19/23 28.52 kg/m  11/28/22 28.87 kg/m      New complaints: None today  Allergies  Allergen Reactions   Amoxicillin Rash   Outpatient Encounter Medications as of 06/25/2023  Medication Sig   atorvastatin (LIPITOR) 40 MG tablet Take 1 tablet (40 mg total) by mouth daily.   Continuous Blood Gluc Receiver (FREESTYLE LIBRE 2 READER) DEVI 1 each by Does not apply route daily. (Patient not taking: Reported on 03/19/2023)   Continuous Blood Gluc Sensor (FREESTYLE LIBRE 14 DAY SENSOR) MISC USE  AS DIRECTED DAILY FOR 14 DAYS. CHANGE DEVICE EVERY 14 DAYS. (Patient not taking: Reported on 03/19/2023)   glipiZIDE (GLUCOTROL) 10 MG tablet Take 2 po q am and 1 po qhs   olmesartan (BENICAR) 40 MG tablet Take 1 tablet (40 mg total) by mouth daily.   ONETOUCH DELICA LANCETS FINE MISC Test 1 x a day and prn  Dx E11.9   Semaglutide, 2 MG/DOSE, 8 MG/3ML SOPN Inject 2 mg as directed once a week.   No facility-administered encounter medications on file as of 06/25/2023.    Past Surgical History:  Procedure Laterality Date   BUNIONECTOMY     TONSILLECTOMY      Family History  Problem Relation Age of Onset   Diabetes Mother    Hypertension Mother    Heart disease Mother    Hyperlipidemia Father    Cancer Father        bladder   Emphysema Father       Controlled substance contract: n/a     Review of Systems  Constitutional:  Negative for diaphoresis.  Eyes:  Negative for pain.  Respiratory:  Negative for shortness of breath.   Cardiovascular:  Negative for chest pain, palpitations and leg swelling.  Gastrointestinal:  Negative for abdominal pain.  Endocrine: Negative for polydipsia.  Skin:  Negative for rash.  Neurological:  Negative for dizziness, weakness and headaches.  Hematological:  Does  not bruise/bleed easily.  All other systems reviewed and are negative.      Objective:   Physical Exam Vitals and nursing note reviewed.  Constitutional:      General: She is not in acute distress.    Appearance: Normal appearance. She is well-developed.  HENT:     Head: Normocephalic.     Right Ear: Tympanic membrane normal.     Left Ear: Tympanic membrane normal.     Nose: Nose normal.     Mouth/Throat:     Mouth: Mucous membranes are moist.  Eyes:     Pupils: Pupils are equal, round, and reactive to light.  Neck:     Vascular: No carotid bruit or JVD.  Cardiovascular:     Rate and Rhythm: Normal rate and regular rhythm.     Heart sounds: Normal heart sounds.   Pulmonary:     Effort: Pulmonary effort is normal. No respiratory distress.     Breath sounds: Normal breath sounds. No wheezing or rales.  Chest:     Chest wall: No tenderness.  Abdominal:     General: Bowel sounds are normal. There is no distension or abdominal bruit.     Palpations: Abdomen is soft. There is no hepatomegaly, splenomegaly, mass or pulsatile mass.     Tenderness: There is no abdominal tenderness.  Musculoskeletal:        General: Normal range of motion.     Cervical back: Normal range of motion and neck supple.  Lymphadenopathy:     Cervical: No cervical adenopathy.  Skin:    General: Skin is warm and dry.  Neurological:     Mental Status: She is alert and oriented to person, place, and time.     Deep Tendon Reflexes: Reflexes are normal and symmetric.  Psychiatric:        Behavior: Behavior normal.        Thought Content: Thought content normal.        Judgment: Judgment normal.     Hgba1c 6.9%  BP (!) 142/86   Pulse 65   Temp 97.9 F (36.6 C) (Temporal)   Resp 20   Ht 5\' 3"  (1.6 m)   Wt 160 lb (72.6 kg)   SpO2 99%   BMI 28.34 kg/m        Assessment & Plan:   Kara Cortez comes in today with chief complaint of Medical Management of Chronic Issues   Diagnosis and orders addressed:  1. Primary hypertension Low sodium diet - CBC with Differential/Platelet - CMP14+EGFR  2. Diabetes mellitus treated with oral medication (HCC) Continue to watch carbs in diet - Bayer DCA Hb A1c Waived  3. Long-term current use of injectable noninsulin antidiabetic medication Continue current dose  4. Hyperlipidemia associated with type 2 diabetes mellitus (HCC) Low fat diet - Lipid panel  5. BMI 29.0-29.9,adult Discussed diet and exercise for person with BMI >25 Will recheck weight in 3-6 months    Labs pending Health Maintenance reviewed Diet and exercise encouraged  Follow up plan: 6 months   Mary-Margaret Daphine Deutscher, FNP

## 2023-06-26 DIAGNOSIS — Z780 Asymptomatic menopausal state: Secondary | ICD-10-CM | POA: Diagnosis not present

## 2023-06-27 ENCOUNTER — Encounter: Payer: Self-pay | Admitting: Nurse Practitioner

## 2023-07-17 DIAGNOSIS — H524 Presbyopia: Secondary | ICD-10-CM | POA: Diagnosis not present

## 2023-07-17 DIAGNOSIS — E119 Type 2 diabetes mellitus without complications: Secondary | ICD-10-CM | POA: Diagnosis not present

## 2023-07-17 LAB — HM DIABETES EYE EXAM

## 2023-07-30 ENCOUNTER — Other Ambulatory Visit: Payer: Self-pay | Admitting: Nurse Practitioner

## 2023-07-30 DIAGNOSIS — E119 Type 2 diabetes mellitus without complications: Secondary | ICD-10-CM

## 2023-09-10 ENCOUNTER — Encounter: Payer: Self-pay | Admitting: Nurse Practitioner

## 2023-09-16 ENCOUNTER — Other Ambulatory Visit: Payer: Self-pay | Admitting: Nurse Practitioner

## 2023-09-16 DIAGNOSIS — I10 Essential (primary) hypertension: Secondary | ICD-10-CM

## 2023-11-02 ENCOUNTER — Ambulatory Visit (INDEPENDENT_AMBULATORY_CARE_PROVIDER_SITE_OTHER): Payer: PPO

## 2023-11-02 VITALS — Ht 63.0 in | Wt 160.0 lb

## 2023-11-02 DIAGNOSIS — Z0000 Encounter for general adult medical examination without abnormal findings: Secondary | ICD-10-CM

## 2023-11-02 NOTE — Patient Instructions (Signed)
Kara Cortez , Thank you for taking time to come for your Medicare Wellness Visit. I appreciate your ongoing commitment to your health goals. Please review the following plan we discussed and let me know if I can assist you in the future.   Referrals/Orders/Follow-Ups/Clinician Recommendations: Aim for 30 minutes of exercise or brisk walking, 6-8 glasses of water, and 5 servings of fruits and vegetables each day.  This is a list of the screening recommended for you and due dates:  Health Maintenance  Topic Date Due   Colon Cancer Screening  07/25/2022   Yearly kidney health urinalysis for diabetes  01/10/2023   COVID-19 Vaccine (4 - 2024-25 season) 06/03/2023   Flu Shot  12/31/2023*   Mammogram  03/18/2024*   Hemoglobin A1C  12/23/2023   Yearly kidney function blood test for diabetes  06/24/2024   Complete foot exam   06/24/2024   Eye exam for diabetics  07/16/2024   Medicare Annual Wellness Visit  11/01/2024   DEXA scan (bone density measurement)  06/25/2025   DTaP/Tdap/Td vaccine (2 - Td or Tdap) 07/24/2029   Pneumonia Vaccine  Completed   Hepatitis C Screening  Completed   Zoster (Shingles) Vaccine  Completed   HPV Vaccine  Aged Out  *Topic was postponed. The date shown is not the original due date.    Advanced directives: (ACP Link)Information on Advanced Care Planning can be found at [facility] Advance Health Care Directives Advance Health Care Directives ([email or web address]   Next Medicare Annual Wellness Visit scheduled for next year: Yes

## 2023-11-02 NOTE — Progress Notes (Signed)
Subjective:   Kara Cortez is a 73 y.o. female who presents for Medicare Annual (Subsequent) preventive examination.  Visit Complete: Virtual I connected with  Kara Cortez on 11/02/23 by a audio enabled telemedicine application and verified that I am speaking with the correct person using two identifiers.  Patient Location: Home  Provider Location: Home Office  This patient declined Interactive audio and video telecommunications. Therefore the visit was completed with audio only.  I discussed the limitations of evaluation and management by telemedicine. The patient expressed understanding and agreed to proceed.  Vital Signs: Because this visit was a virtual/telehealth visit, some criteria may be missing or patient reported. Any vitals not documented were not able to be obtained and vitals that have been documented are patient reported.  Cardiac Risk Factors include: advanced age (>75men, >61 women);diabetes mellitus;dyslipidemia;hypertension     Objective:    Today's Vitals   11/02/23 1346  Weight: 160 lb (72.6 kg)  Height: 5\' 3"  (1.6 m)   Body mass index is 28.34 kg/m.     11/02/2023    1:48 PM 10/30/2022    2:15 PM  Advanced Directives  Does Patient Have a Medical Advance Directive? No No  Would patient like information on creating a medical advance directive? Yes (MAU/Ambulatory/Procedural Areas - Information given) No - Patient declined    Current Medications (verified) Outpatient Encounter Medications as of 11/02/2023  Medication Sig   atorvastatin (LIPITOR) 40 MG tablet Take 1 tablet (40 mg total) by mouth daily.   glipiZIDE (GLUCOTROL) 10 MG tablet Take 2 po q am and 1 po qhs   olmesartan (BENICAR) 40 MG tablet TAKE 1 TABLET BY MOUTH EVERY DAY   ONETOUCH DELICA LANCETS FINE MISC Test 1 x a day and prn  Dx E11.9   Semaglutide, 2 MG/DOSE, (OZEMPIC, 2 MG/DOSE,) 8 MG/3ML SOPN INJECT 2 MG AS DIRECTED ONCE A WEEK.   Continuous Blood Gluc Receiver  (FREESTYLE LIBRE 2 READER) DEVI 1 each by Does not apply route daily. (Patient not taking: Reported on 11/02/2023)   Continuous Blood Gluc Sensor (FREESTYLE LIBRE 14 DAY SENSOR) MISC USE AS DIRECTED DAILY FOR 14 DAYS. CHANGE DEVICE EVERY 14 DAYS. (Patient not taking: Reported on 11/02/2023)   No facility-administered encounter medications on file as of 11/02/2023.    Allergies (verified) Amoxicillin   History: Past Medical History:  Diagnosis Date   Diabetes mellitus without complication (HCC)    Hypertension    Past Surgical History:  Procedure Laterality Date   BUNIONECTOMY     TONSILLECTOMY     Family History  Problem Relation Age of Onset   Diabetes Mother    Hypertension Mother    Heart disease Mother    Hyperlipidemia Father    Cancer Father        bladder   Emphysema Father    Social History   Socioeconomic History   Marital status: Married    Spouse name: Not on file   Number of children: Not on file   Years of education: Not on file   Highest education level: Not on file  Occupational History   Not on file  Tobacco Use   Smoking status: Never   Smokeless tobacco: Never  Substance and Sexual Activity   Alcohol use: Yes    Comment: occasional   Drug use: No   Sexual activity: Not on file  Other Topics Concern   Not on file  Social History Narrative   Not on file  Social Drivers of Corporate investment banker Strain: Low Risk  (11/02/2023)   Overall Financial Resource Strain (CARDIA)    Difficulty of Paying Living Expenses: Not hard at all  Food Insecurity: No Food Insecurity (11/02/2023)   Hunger Vital Sign    Worried About Running Out of Food in the Last Year: Never true    Ran Out of Food in the Last Year: Never true  Transportation Needs: No Transportation Needs (11/02/2023)   PRAPARE - Administrator, Civil Service (Medical): No    Lack of Transportation (Non-Medical): No  Physical Activity: Sufficiently Active (11/02/2023)   Exercise  Vital Sign    Days of Exercise per Week: 5 days    Minutes of Exercise per Session: 30 min  Stress: No Stress Concern Present (11/02/2023)   [employer] of Occupational Health - Occupational Stress Questionnaire    Feeling of Stress : Not at all  Social Connections: Socially Integrated (11/02/2023)   Social Connection and Isolation Panel [NHANES]    Frequency of Communication with Friends and Family: More than three times a week    Frequency of Social Gatherings with Friends and Family: Three times a week    Attends Religious Services: 1 to 4 times per year    Active Member of Clubs or Organizations: Yes    Attends Banker Meetings: 1 to 4 times per year    Marital Status: Married    Tobacco Counseling Counseling given: Not Answered   Clinical Intake:  Pre-visit preparation completed: Yes  Pain : No/denies pain     Diabetes: Yes CBG done?: No Did pt. bring in CBG monitor from home?: No  How often do you need to have someone help you when you read instructions, pamphlets, or other written materials from your doctor or pharmacy?: 1 - Never  Interpreter Needed?: No  Information entered by :: Kara Fantasia LPN   Activities of Daily Living    11/02/2023    1:48 PM  In your present state of health, do you have any difficulty performing the following activities:  Hearing? 1  Vision? 0  Difficulty concentrating or making decisions? 0  Walking or climbing stairs? 0  Dressing or bathing? 0  Doing errands, shopping? 0  Preparing Food and eating ? N  Using the Toilet? N  In the past six months, have you accidently leaked urine? N  Do you have problems with loss of bowel control? N  Managing your Medications? N  Managing your Finances? N  Housekeeping or managing your Housekeeping? N    Patient Care Team: Bennie Pierini, FNP as PCP - General (Family Medicine) Janalyn Harder, MD (Inactive) as Consulting Physician (Dermatology) Danella Maiers,  [facility] as Pharmacist (Family Medicine) Sinda Du, MD as Consulting Physician (Ophthalmology)  Indicate any recent Medical Services you may have received from other than [facility] providers in the past year (date may be approximate).     Assessment:   This is a routine wellness examination for Kara Cortez.  Hearing/Vision screen Hearing Screening - Comments:: Denies hearing difficulties   Vision Screening - Comments:: Wears rx glasses - up to date with routine eye exams with Dr. Cathey Endow     Goals Addressed   None   Depression Screen    11/02/2023    1:47 PM 06/25/2023    9:27 AM 03/19/2023    9:31 AM 11/28/2022    8:34 AM 10/30/2022    2:13 PM 09/21/2022    8:19 AM 06/16/2022  9:30 AM  PHQ 2/9 Scores  PHQ - 2 Score 0 0 0 0 0 0 0  PHQ- 9 Score  0 2 0  0 0    Fall Risk    11/02/2023    1:48 PM 06/25/2023    9:27 AM 03/19/2023    9:30 AM 11/28/2022    8:34 AM 10/30/2022    2:12 PM  Fall Risk   Falls in the past year? 0 0 0 0 0  Number falls in past yr: 0    0  Injury with Fall? 0    0  Risk for fall due to : No Fall Risks      Follow up Falls prevention discussed;Education provided;Falls evaluation completed    Falls prevention discussed;Education provided;Falls evaluation completed    MEDICARE RISK AT HOME: Medicare Risk at Home Any stairs in or around the home?: No If so, are there any without handrails?: No Home free of loose throw rugs in walkways, pet beds, electrical cords, etc?: Yes Adequate lighting in your home to reduce risk of falls?: Yes Life alert?: No Use of a cane, walker or w/c?: No Grab bars in the bathroom?: Yes Shower chair or bench in shower?: No Elevated toilet seat or a handicapped toilet?: Yes  TIMED UP AND GO:  Was the test performed?  No    Cognitive Function:        11/02/2023    1:48 PM 10/30/2022    2:16 PM  6CIT Screen  What Year? 0 points 0 points  What month? 0 points 0 points  What time? 0 points 0 points  Count back from 20 0  points 0 points  Months in reverse 0 points 0 points  Repeat phrase 0 points 0 points  Total Score 0 points 0 points    Immunizations Immunization History  Administered Date(s) Administered   Moderna Sars-Covid-2 Vaccination 11/26/2019, 12/24/2019, 08/21/2020   Pneumococcal Conjugate-13 02/25/2016   Pneumococcal Polysaccharide-23 04/06/2017   Tdap 07/25/2019   Zoster Recombinant(Shingrix) 12/06/2020, 01/09/2022    TDAP status: Up to date  Flu Vaccine status: Declined, Education has been provided regarding the importance of this vaccine but patient still declined. Advised may receive this vaccine at local pharmacy or Health Dept. Aware to provide a copy of the vaccination record if obtained from local pharmacy or Health Dept. Verbalized acceptance and understanding.  Pneumococcal vaccine status: Up to date  Covid-19 vaccine status: Information provided on how to obtain vaccines.   Qualifies for Shingles Vaccine? Yes   Zostavax completed No   Shingrix Completed?: Yes  Screening Tests Health Maintenance  Topic Date Due   Colonoscopy  07/25/2022   Diabetic kidney evaluation - Urine ACR  01/10/2023   COVID-19 Vaccine (4 - 2024-25 season) 06/03/2023   INFLUENZA VACCINE  12/31/2023 (Originally 05/03/2023)   MAMMOGRAM  03/18/2024 (Originally 06/11/2020)   HEMOGLOBIN A1C  12/23/2023   Diabetic kidney evaluation - eGFR measurement  06/24/2024   FOOT EXAM  06/24/2024   OPHTHALMOLOGY EXAM  07/16/2024   Medicare Annual Wellness (AWV)  11/01/2024   DEXA SCAN  06/25/2025   DTaP/Tdap/Td (2 - Td or Tdap) 07/24/2029   Pneumonia Vaccine 71+ Years old  Completed   Hepatitis C Screening  Completed   Zoster Vaccines- Shingrix  Completed   HPV VACCINES  Aged Out    Health Maintenance  Health Maintenance Due  Topic Date Due   Colonoscopy  07/25/2022   Diabetic kidney evaluation - Urine ACR  01/10/2023  COVID-19 Vaccine (4 - 2024-25 season) 06/03/2023    Colorectal cancer screening:   Declines further at this time   Mammogram status:  Patient states that she plans to schedule; uses out of system provider   Bone Density status: Completed 06/26/23. Results reflect: Bone density results: NORMAL. Repeat every 5 years.  Lung Cancer Screening: (Low Dose CT Chest recommended if Age 74-80 years, 20 pack-year currently smoking OR have quit w/in 15years.) does not qualify.   Lung Cancer Screening Referral: n/a  Additional Screening:  Hepatitis C Screening: does qualify; Completed 02/25/16  Vision Screening: Recommended annual ophthalmology exams for early detection of glaucoma and other disorders of the eye. Is the patient up to date with their annual eye exam?  Yes  Who is the provider or what is the name of the office in which the patient attends annual eye exams? Dr. Cathey Endow  If pt is not established with a provider, would they like to be referred to a provider to establish care? No .   Dental Screening: Recommended annual dental exams for proper oral hygiene  Diabetic Foot Exam: Diabetic Foot Exam: Completed 06/25/23  Community Resource Referral / Chronic Care Management: CRR required this visit?  No   CCM required this visit?  No     Plan:     I have personally reviewed and noted the following in the patient's chart:   Medical and social history Use of alcohol, tobacco or illicit drugs  Current medications and supplements including opioid prescriptions. Patient is not currently taking opioid prescriptions. Functional ability and status Nutritional status Physical activity Advanced directives List of other physicians Hospitalizations, surgeries, and ER visits in previous 12 months Vitals Screenings to include cognitive, depression, and falls Referrals and appointments  In addition, I have reviewed and discussed with patient certain preventive protocols, quality metrics, and best practice recommendations. A written personalized care plan for preventive services  as well as general preventive health recommendations were provided to patient.     Kara Cortez [place], [place]   [id number]/84/[id number]   After Visit Summary: (MyChart) Due to this being a telephonic visit, the after visit summary with patients personalized plan was offered to patient via MyChart   Nurse Notes: No concerns at this time

## 2023-12-20 ENCOUNTER — Encounter: Payer: Self-pay | Admitting: Nurse Practitioner

## 2023-12-20 ENCOUNTER — Ambulatory Visit (INDEPENDENT_AMBULATORY_CARE_PROVIDER_SITE_OTHER)

## 2023-12-20 ENCOUNTER — Ambulatory Visit: Payer: PPO | Admitting: Nurse Practitioner

## 2023-12-20 VITALS — BP 141/86 | HR 72 | Temp 97.4°F | Ht 64.0 in | Wt 159.2 lb

## 2023-12-20 DIAGNOSIS — E1169 Type 2 diabetes mellitus with other specified complication: Secondary | ICD-10-CM | POA: Diagnosis not present

## 2023-12-20 DIAGNOSIS — Z0001 Encounter for general adult medical examination with abnormal findings: Secondary | ICD-10-CM

## 2023-12-20 DIAGNOSIS — Z6829 Body mass index (BMI) 29.0-29.9, adult: Secondary | ICD-10-CM | POA: Diagnosis not present

## 2023-12-20 DIAGNOSIS — E785 Hyperlipidemia, unspecified: Secondary | ICD-10-CM

## 2023-12-20 DIAGNOSIS — Z0000 Encounter for general adult medical examination without abnormal findings: Secondary | ICD-10-CM

## 2023-12-20 DIAGNOSIS — I10 Essential (primary) hypertension: Secondary | ICD-10-CM | POA: Diagnosis not present

## 2023-12-20 DIAGNOSIS — Z7984 Long term (current) use of oral hypoglycemic drugs: Secondary | ICD-10-CM

## 2023-12-20 DIAGNOSIS — Z7985 Long-term (current) use of injectable non-insulin antidiabetic drugs: Secondary | ICD-10-CM

## 2023-12-20 DIAGNOSIS — Z01419 Encounter for gynecological examination (general) (routine) without abnormal findings: Secondary | ICD-10-CM | POA: Diagnosis not present

## 2023-12-20 DIAGNOSIS — E119 Type 2 diabetes mellitus without complications: Secondary | ICD-10-CM

## 2023-12-20 NOTE — Addendum Note (Signed)
 Addended by: Bennie Pierini on: 12/20/2023 04:38 PM   Modules accepted: Level of Service

## 2023-12-20 NOTE — Progress Notes (Signed)
 Subjective:    Patient ID: Kara Cortez, female    DOB: September 28, 1951, 73 y.o.   MRN: [id number]   Chief Complaint: .annual physical   HPI:  Kara Cortez is a 73 y.o. who identifies as a female who was assigned female at birth.   Social history: Lives with: husband Work history: retired   Water engineer in today for follow up of the following chronic medical issues:  1. Annual physical   2. Primary hypertension No c/o chest pain, sob or headache. Does not check blood pressure at home. BP Readings from Last 3 Encounters:  06/25/23 (!) 142/86  03/19/23 131/81  11/28/22 (!) 141/86     3. Mixed hyperlipidemia Does not really watch diet and does no dedicated exercise. She trys to stay active. Lab Results  Component Value Date   CHOL 141 06/25/2023   HDL 73 06/25/2023   LDLCALC 51 06/25/2023   TRIG 94 06/25/2023   CHOLHDL 1.9 06/25/2023      4. Diabetes mellitis treated with oral meds  5. Type 2 diabetes mellitus without complication, without long-term current use of insulin (HCC) She has not been checking blood sugars and has not been wearing libre.. She was encouraged to wear libre daily because she does better when she wears it. Stricter carb counting Lab Results  Component Value Date   HGBA1C 6.9 (H) 06/25/2023      6. BMI 28.0-28.9,adult No recent weight changes  Wt Readings from Last 3 Encounters:  12/20/23 159 lb 3.2 oz (72.2 kg)  11/02/23 160 lb (72.6 kg)  06/25/23 160 lb (72.6 kg)   BMI Readings from Last 3 Encounters:  12/20/23 27.33 kg/m  11/02/23 28.34 kg/m  06/25/23 28.34 kg/m        New complaints: None today  Allergies  Allergen Reactions   Amoxicillin Rash   Outpatient Encounter Medications as of 12/20/2023  Medication Sig   atorvastatin (LIPITOR) 40 MG tablet Take 1 tablet (40 mg total) by mouth daily.   Continuous Blood Gluc Receiver (FREESTYLE LIBRE 2 READER) DEVI 1 each by Does not apply route daily. (Patient not  taking: Reported on 11/02/2023)   Continuous Blood Gluc Sensor (FREESTYLE LIBRE 14 DAY SENSOR) MISC USE AS DIRECTED DAILY FOR 14 DAYS. CHANGE DEVICE EVERY 14 DAYS. (Patient not taking: Reported on 11/02/2023)   glipiZIDE (GLUCOTROL) 10 MG tablet Take 2 po q am and 1 po qhs   olmesartan (BENICAR) 40 MG tablet TAKE 1 TABLET BY MOUTH EVERY DAY   ONETOUCH DELICA LANCETS FINE MISC Test 1 x a day and prn  Dx E11.9   Semaglutide, 2 MG/DOSE, (OZEMPIC, 2 MG/DOSE,) 8 MG/3ML SOPN INJECT 2 MG AS DIRECTED ONCE A WEEK.   No facility-administered encounter medications on file as of 12/20/2023.    Past Surgical History:  Procedure Laterality Date   BUNIONECTOMY     TONSILLECTOMY      Family History  Problem Relation Age of Onset   Diabetes Mother    Hypertension Mother    Heart disease Mother    Hyperlipidemia Father    Cancer Father        bladder   Emphysema Father       Controlled substance contract: n/a     Review of Systems  Constitutional:  Negative for diaphoresis.  Eyes:  Negative for pain.  Respiratory:  Negative for shortness of breath.   Cardiovascular:  Negative for chest pain, palpitations and leg swelling.  Gastrointestinal:  Negative for abdominal pain.  Endocrine: Negative for polydipsia.  Skin:  Negative for rash.  Neurological:  Negative for dizziness, weakness and headaches.  Hematological:  Does not bruise/bleed easily.  All other systems reviewed and are negative.      Objective:   Physical Exam Vitals and nursing note reviewed.  Constitutional:      General: She is not in acute distress.    Appearance: Normal appearance. She is well-developed.  HENT:     Head: Normocephalic.     Right Ear: Tympanic membrane normal.     Left Ear: Tympanic membrane normal.     Nose: Nose normal.     Mouth/Throat:     Mouth: Mucous membranes are moist.  Eyes:     Pupils: Pupils are equal, round, and reactive to light.  Neck:     Vascular: No carotid bruit or JVD.   Cardiovascular:     Rate and Rhythm: Normal rate and regular rhythm.     Heart sounds: Normal heart sounds.  Pulmonary:     Effort: Pulmonary effort is normal. No respiratory distress.     Breath sounds: Normal breath sounds. No wheezing or rales.  Chest:     Chest wall: No tenderness.  Abdominal:     General: Bowel sounds are normal. There is no distension or abdominal bruit.     Palpations: Abdomen is soft. There is no hepatomegaly, splenomegaly, mass or pulsatile mass.     Tenderness: There is no abdominal tenderness.  Musculoskeletal:        General: Normal range of motion.     Cervical back: Normal range of motion and neck supple.  Lymphadenopathy:     Cervical: No cervical adenopathy.  Skin:    General: Skin is warm and dry.  Neurological:     Mental Status: She is alert and oriented to person, place, and time.     Deep Tendon Reflexes: Reflexes are normal and symmetric.  Psychiatric:        Behavior: Behavior normal.        Thought Content: Thought content normal.        Judgment: Judgment normal.     BP (!) 141/86   Pulse 72   Temp (!) 97.4 F (36.3 C)   Ht 5\' 4"  (1.626 m)   Wt 159 lb 3.2 oz (72.2 kg)   SpO2 100%   BMI 27.33 kg/m   EKG- NSR-Mary-Margaret Daphine Deutscher, FNP  Chest xray- pending radiology report-Preliminary reading by Paulene Floor, FNP  [facility]       Assessment & Plan:   Kara Cortez comes in today with chief complaint of medical management of chronic issues    Diagnosis and orders addressed:  1. Annual physical exam  2. Primary hypertension Low sodium diet - CBC with Differential/Platelet - CMP14+EGFR  3. Mixed hyperlipidemia Low fat diet - Lipid panel - atorvastatin (LIPITOR) 40 MG tablet; Take 1 tablet (40 mg total) by mouth daily.  Dispense: 90 tablet; Refill: 1  4. Type 2 diabetes mellitus without complication, without long-term current use of insulin (HCC) Stricter carb counting Does not want to make changes to meds- wants  to wait and see if this study will help with hgab1c - Bayer DCA Hb A1c Waived - Continuous Blood Gluc Sensor (FREESTYLE LIBRE 14 DAY SENSOR) MISC; USE AS DIRECTED DAILY FOR 14 DAYS. CHANGE DEVICE EVERY 14 DAYS.  Dispense: 2 each; Refill: 5 - glipiZIDE (GLUCOTROL) 10 MG tablet; Take 2 po q am and 1 po qhs  Dispense:  270 tablet; Refill: 1 - Semaglutide, 1 MG/DOSE, 4 MG/3ML SOPN; Inject 1 mg as directed once a week.  Dispense: 3 mL; Refill: 3  5. BMI 29.0-29.9,adult Discussed diet and exercise for person with BMI >25 Will recheck weight in 3-6 months  Labs pending Health Maintenance reviewed Diet and exercise encouraged  Follow up plan: 3 months   Mary-Margaret Daphine Deutscher, FNP

## 2023-12-21 LAB — CBC WITH DIFFERENTIAL/PLATELET
Basophils Absolute: 0.1 10*3/uL (ref 0.0–0.2)
Basos: 1 %
EOS (ABSOLUTE): 0.2 10*3/uL (ref 0.0–0.4)
Eos: 3 %
Hematocrit: 44 % (ref 34.0–46.6)
Hemoglobin: 14.6 g/dL (ref 11.1–15.9)
Immature Grans (Abs): 0 10*3/uL (ref 0.0–0.1)
Immature Granulocytes: 0 %
Lymphocytes Absolute: 2.6 10*3/uL (ref 0.7–3.1)
Lymphs: 35 %
MCH: 27.7 pg (ref 26.6–33.0)
MCHC: 33.2 g/dL (ref 31.5–35.7)
MCV: 83 fL (ref 79–97)
Monocytes Absolute: 0.4 10*3/uL (ref 0.1–0.9)
Monocytes: 6 %
Neutrophils Absolute: 4.1 10*3/uL (ref 1.4–7.0)
Neutrophils: 55 %
Platelets: 248 10*3/uL (ref 150–450)
RBC: 5.28 x10E6/uL (ref 3.77–5.28)
RDW: 13.6 % (ref 11.7–15.4)
WBC: 7.3 10*3/uL (ref 3.4–10.8)

## 2023-12-21 LAB — CMP14+EGFR
ALT: 12 IU/L (ref 0–32)
AST: 16 IU/L (ref 0–40)
Albumin: 4.4 g/dL (ref 3.8–4.8)
Alkaline Phosphatase: 105 IU/L (ref 44–121)
BUN/Creatinine Ratio: 13 (ref 12–28)
BUN: 11 mg/dL (ref 8–27)
Bilirubin Total: 0.5 mg/dL (ref 0.0–1.2)
CO2: 23 mmol/L (ref 20–29)
Calcium: 9.6 mg/dL (ref 8.7–10.3)
Chloride: 100 mmol/L (ref 96–106)
Creatinine, Ser: 0.86 mg/dL (ref 0.57–1.00)
Globulin, Total: 2.3 g/dL (ref 1.5–4.5)
Glucose: 82 mg/dL (ref 70–99)
Potassium: 4.3 mmol/L (ref 3.5–5.2)
Sodium: 138 mmol/L (ref 134–144)
Total Protein: 6.7 g/dL (ref 6.0–8.5)
eGFR: 72 mL/min/{1.73_m2} (ref >59)

## 2023-12-21 LAB — LIPID PANEL
Chol/HDL Ratio: 2 ratio (ref 0.0–4.4)
Cholesterol, Total: 146 mg/dL (ref 100–199)
HDL: 72 mg/dL (ref >39)
LDL Chol Calc (NIH): 54 mg/dL (ref 0–99)
Triglycerides: 116 mg/dL (ref 0–149)
VLDL Cholesterol Cal: 20 mg/dL (ref 5–40)

## 2023-12-21 LAB — THYROID PANEL WITH TSH
Free Thyroxine Index: 2 (ref 1.2–4.9)
T3 Uptake Ratio: 30 % (ref 24–39)
T4, Total: 6.6 ug/dL (ref 4.5–12.0)
TSH: 2.27 u[IU]/mL (ref 0.450–4.500)

## 2024-01-13 ENCOUNTER — Other Ambulatory Visit: Payer: Self-pay | Admitting: Nurse Practitioner

## 2024-01-13 DIAGNOSIS — E119 Type 2 diabetes mellitus without complications: Secondary | ICD-10-CM

## 2024-03-13 ENCOUNTER — Telehealth: Admitting: Physician Assistant

## 2024-03-13 DIAGNOSIS — J069 Acute upper respiratory infection, unspecified: Secondary | ICD-10-CM

## 2024-03-13 MED ORDER — IPRATROPIUM BROMIDE 0.03 % NA SOLN: 2.0000 | Freq: Two times a day (BID) | NASAL | 0 refills | Status: AC

## 2024-03-13 NOTE — Progress Notes (Signed)

## 2024-03-13 NOTE — Progress Notes (Signed)
 I have spent 5 minutes in review of e-visit questionnaire, review and updating patient chart, medical decision making and response to patient.   Piedad Climes, PA-C

## 2024-03-13 NOTE — Progress Notes (Signed)
 Message sent to patient requesting further input regarding current symptoms. Awaiting patient response.

## 2024-03-17 ENCOUNTER — Ambulatory Visit (INDEPENDENT_AMBULATORY_CARE_PROVIDER_SITE_OTHER): Admitting: Nurse Practitioner

## 2024-03-17 ENCOUNTER — Encounter: Payer: Self-pay | Admitting: Nurse Practitioner

## 2024-03-17 VITALS — BP 136/82 | HR 88 | Temp 98.0°F | Ht 64.0 in | Wt 160.0 lb

## 2024-03-17 DIAGNOSIS — E119 Type 2 diabetes mellitus without complications: Secondary | ICD-10-CM

## 2024-03-17 DIAGNOSIS — J40 Bronchitis, not specified as acute or chronic: Secondary | ICD-10-CM | POA: Diagnosis not present

## 2024-03-17 DIAGNOSIS — E782 Mixed hyperlipidemia: Secondary | ICD-10-CM

## 2024-03-17 DIAGNOSIS — Z6829 Body mass index (BMI) 29.0-29.9, adult: Secondary | ICD-10-CM | POA: Diagnosis not present

## 2024-03-17 DIAGNOSIS — E1169 Type 2 diabetes mellitus with other specified complication: Secondary | ICD-10-CM | POA: Diagnosis not present

## 2024-03-17 DIAGNOSIS — Z7984 Long term (current) use of oral hypoglycemic drugs: Secondary | ICD-10-CM | POA: Diagnosis not present

## 2024-03-17 DIAGNOSIS — I10 Essential (primary) hypertension: Secondary | ICD-10-CM

## 2024-03-17 DIAGNOSIS — E785 Hyperlipidemia, unspecified: Secondary | ICD-10-CM

## 2024-03-17 DIAGNOSIS — Z7985 Long-term (current) use of injectable non-insulin antidiabetic drugs: Secondary | ICD-10-CM | POA: Diagnosis not present

## 2024-03-17 LAB — CBC WITH DIFFERENTIAL/PLATELET
Basophils Absolute: 0.1 10*3/uL (ref 0.0–0.2)
Basos: 1 %
EOS (ABSOLUTE): 0.4 10*3/uL (ref 0.0–0.4)
Eos: 4 %
Hematocrit: 44.6 % (ref 34.0–46.6)
Hemoglobin: 14.4 g/dL (ref 11.1–15.9)
Immature Grans (Abs): 0 10*3/uL (ref 0.0–0.1)
Immature Granulocytes: 0 %
Lymphocytes Absolute: 2.4 10*3/uL (ref 0.7–3.1)
Lymphs: 26 %
MCH: 27.7 pg (ref 26.6–33.0)
MCHC: 32.3 g/dL (ref 31.5–35.7)
MCV: 86 fL (ref 79–97)
Monocytes Absolute: 0.7 10*3/uL (ref 0.1–0.9)
Monocytes: 7 %
Neutrophils Absolute: 5.9 10*3/uL (ref 1.4–7.0)
Neutrophils: 62 %
Platelets: 258 10*3/uL (ref 150–450)
RBC: 5.2 x10E6/uL (ref 3.77–5.28)
RDW: 13.7 % (ref 11.7–15.4)
WBC: 9.5 10*3/uL (ref 3.4–10.8)

## 2024-03-17 LAB — BAYER DCA HB A1C WAIVED: HB A1C (BAYER DCA - WAIVED): 7.2 % — ABNORMAL HIGH (ref 4.8–5.6)

## 2024-03-17 LAB — CMP14+EGFR
ALT: 20 IU/L (ref 0–32)
AST: 18 IU/L (ref 0–40)
Albumin: 4.3 g/dL (ref 3.8–4.8)
Alkaline Phosphatase: 123 IU/L — ABNORMAL HIGH (ref 44–121)
BUN/Creatinine Ratio: 16 (ref 12–28)
BUN: 13 mg/dL (ref 8–27)
Bilirubin Total: 0.3 mg/dL (ref 0.0–1.2)
CO2: 20 mmol/L (ref 20–29)
Calcium: 9.4 mg/dL (ref 8.7–10.3)
Chloride: 101 mmol/L (ref 96–106)
Creatinine, Ser: 0.83 mg/dL (ref 0.57–1.00)
Globulin, Total: 2.4 g/dL (ref 1.5–4.5)
Glucose: 176 mg/dL — ABNORMAL HIGH (ref 70–99)
Potassium: 4.3 mmol/L (ref 3.5–5.2)
Sodium: 139 mmol/L (ref 134–144)
Total Protein: 6.7 g/dL (ref 6.0–8.5)
eGFR: 75 mL/min/{1.73_m2} (ref >59)

## 2024-03-17 LAB — LIPID PANEL
Chol/HDL Ratio: 2.8 ratio (ref 0.0–4.4)
Cholesterol, Total: 176 mg/dL (ref 100–199)
HDL: 63 mg/dL (ref >39)
LDL Chol Calc (NIH): 84 mg/dL (ref 0–99)
Triglycerides: 169 mg/dL — ABNORMAL HIGH (ref 0–149)
VLDL Cholesterol Cal: 29 mg/dL (ref 5–40)

## 2024-03-17 MED ORDER — OZEMPIC (2 MG/DOSE) 8 MG/3ML ~~LOC~~ SOPN: 2.0000 mg | PEN_INJECTOR | SUBCUTANEOUS | 1 refills | Status: DC

## 2024-03-17 MED ORDER — GLIPIZIDE 10 MG PO TABS: ORAL_TABLET | ORAL | 1 refills | Status: DC

## 2024-03-17 MED ORDER — ATORVASTATIN CALCIUM 40 MG PO TABS: 40.0000 mg | ORAL_TABLET | Freq: Every day | ORAL | 1 refills | Status: DC

## 2024-03-17 MED ORDER — FREESTYLE LIBRE 14 DAY SENSOR MISC: 5 refills | Status: DC

## 2024-03-17 MED ORDER — OLMESARTAN MEDOXOMIL 40 MG PO TABS: 40.0000 mg | ORAL_TABLET | Freq: Every day | ORAL | 1 refills | Status: DC

## 2024-03-17 MED ORDER — HYDROCODONE BIT-HOMATROP MBR 5-1.5 MG/5ML PO SOLN: 5.0000 mL | Freq: Four times a day (QID) | ORAL | 0 refills | Status: DC | PRN

## 2024-03-17 MED ORDER — PREDNISONE 20 MG PO TABS: 40.0000 mg | ORAL_TABLET | Freq: Every day | ORAL | 0 refills | Status: AC

## 2024-03-17 NOTE — Progress Notes (Signed)
 Subjective:    Patient ID: Kara Cortez, female    DOB: 1951-06-07, 73 y.o.   MRN: [id number]   Chief Complaint: medical management of chronic issues    HPI:  Kara Cortez is a 73 y.o. who identifies as a female who was assigned female at birth.   Social history: Lives with: husband Work history: retired   Water engineer in today for follow up of the following chronic medical issues:  1. Primary hypertension No c/o chest pain, sob or headache. Does not check blood pressure at home. BP Readings from Last 3 Encounters:  12/20/23 (!) 141/86  06/25/23 (!) 142/86  03/19/23 131/81     2. Diabetes mellitus treated with oral medication (HCC) She does not check blood sugars at home. Lab Results  Component Value Date   HGBA1C 6.9 (H) 06/25/2023     3. Long-term current use of injectable noninsulin antidiabetic medication Is on ozempic  weekly  4. Hyperlipidemia associated with type 2 diabetes mellitus (HCC) Is on lipitor and is doing well. She does try to wtahc diet but no dedictaed exercise. Lab Results  Component Value Date   CHOL 146 12/20/2023   HDL 72 12/20/2023   LDLCALC 54 12/20/2023   TRIG 116 12/20/2023   CHOLHDL 2.0 12/20/2023   The 10-year ASCVD risk score (Arnett DK, et al., 2019) is: 33.6%   5. BMI 29.0-29.9,adult No recent weight changes Wt Readings from Last 3 Encounters:  12/20/23 159 lb 3.2 oz (72.2 kg)  11/02/23 160 lb (72.6 kg)  06/25/23 160 lb (72.6 kg)   BMI Readings from Last 3 Encounters:  12/20/23 27.33 kg/m  11/02/23 28.34 kg/m  06/25/23 28.34 kg/m      New complaints: Cough and congestion with right ear congestion. Started 1 week ago and is no better. Has tried Mucinex and coricidin. Did evist and was prescribed nasal spray.  No fever  Allergies  Allergen Reactions   Amoxicillin  Rash   Outpatient Encounter Medications as of 03/17/2024  Medication Sig   atorvastatin  (LIPITOR) 40 MG tablet Take 1 tablet (40 mg total) by  mouth daily.   Continuous Blood Gluc Receiver (FREESTYLE LIBRE 2 READER) DEVI 1 each by Does not apply route daily.   Continuous Blood Gluc Sensor (FREESTYLE LIBRE 14 DAY SENSOR) MISC USE AS DIRECTED DAILY FOR 14 DAYS. CHANGE DEVICE EVERY 14 DAYS.   glipiZIDE  (GLUCOTROL ) 10 MG tablet Take 2 po q am and 1 po qhs   ipratropium (ATROVENT ) 0.03 % nasal spray Place 2 sprays into both nostrils every 12 (twelve) hours.   olmesartan  (BENICAR ) 40 MG tablet TAKE 1 TABLET BY MOUTH EVERY DAY   ONETOUCH DELICA LANCETS FINE MISC Test 1 x a day and prn  Dx E11.9   Semaglutide , 2 MG/DOSE, (OZEMPIC , 2 MG/DOSE,) 8 MG/3ML SOPN INJECT 2 MG AS DIRECTED ONCE A WEEK.   No facility-administered encounter medications on file as of 03/17/2024.    Past Surgical History:  Procedure Laterality Date   BUNIONECTOMY     TONSILLECTOMY      Family History  Problem Relation Age of Onset   Diabetes Mother    Hypertension Mother    Heart disease Mother    Hyperlipidemia Father    Cancer Father        bladder   Emphysema Father       Controlled substance contract: n/a     Review of Systems  Constitutional:  Negative for diaphoresis.  Eyes:  Negative for pain.  Respiratory:  Negative for shortness of breath.   Cardiovascular:  Negative for chest pain, palpitations and leg swelling.  Gastrointestinal:  Negative for abdominal pain.  Endocrine: Negative for polydipsia.  Skin:  Negative for rash.  Neurological:  Negative for dizziness, weakness and headaches.  Hematological:  Does not bruise/bleed easily.  All other systems reviewed and are negative.      Objective:   Physical Exam Vitals and nursing note reviewed.  Constitutional:      General: She is not in acute distress.    Appearance: Normal appearance. She is well-developed.  HENT:     Head: Normocephalic.     Right Ear: Tympanic membrane normal.     Left Ear: Tympanic membrane normal.     Nose: Nose normal.     Mouth/Throat:     Mouth:  Mucous membranes are moist.   Eyes:     Pupils: Pupils are equal, round, and reactive to light.   Neck:     Vascular: No carotid bruit or JVD.   Cardiovascular:     Rate and Rhythm: Normal rate and regular rhythm.     Heart sounds: Normal heart sounds.  Pulmonary:     Effort: Pulmonary effort is normal. No respiratory distress.     Breath sounds: Normal breath sounds. No wheezing or rales.  Chest:     Chest wall: No tenderness.  Abdominal:     General: Bowel sounds are normal. There is no distension or abdominal bruit.     Palpations: Abdomen is soft. There is no hepatomegaly, splenomegaly, mass or pulsatile mass.     Tenderness: There is no abdominal tenderness.   Musculoskeletal:        General: Normal range of motion.     Cervical back: Normal range of motion and neck supple.  Lymphadenopathy:     Cervical: No cervical adenopathy.   Skin:    General: Skin is warm and dry.   Neurological:     Mental Status: She is alert and oriented to person, place, and time.     Deep Tendon Reflexes: Reflexes are normal and symmetric.   Psychiatric:        Behavior: Behavior normal.        Thought Content: Thought content normal.        Judgment: Judgment normal.     Hgba1c 7.2%  BP 136/82   Pulse 88   Temp 98 F (36.7 C) (Temporal)   Ht 5' 4 (1.626 m)   Wt 160 lb (72.6 kg)   SpO2 96%   BMI 27.46 kg/m         Assessment & Plan:   Kara Cortez comes in today with chief complaint of medical management of chronic issues    Diagnosis and orders addressed:  1. Primary hypertension Low sodium diet - CBC with Differential/Platelet - CMP14+EGFR  2. Diabetes mellitus treated with oral medication (HCC) Continue to watch carbs in diet - Bayer DCA Hb A1c Waived  3. Long-term current use of injectable noninsulin antidiabetic medication Continue current dose  4. Hyperlipidemia associated with type 2 diabetes mellitus (HCC) Low fat diet - Lipid panel  5.  BMI 29.0-29.9,adult Discussed diet and exercise for person with BMI >25 Will recheck weight in 3-6 months  6. Bronchitis Force fluids Ru humidifier Steroids will increase blood sugars Prednisone  20mg  2 tablets at the same time daily for 5 days Hycodan as prescribed  Labs pending Health Maintenance reviewed Diet and exercise encouraged  Follow up plan: 6  months   Mary-Margaret Gaylyn Keas, FNP

## 2024-03-18 LAB — MICROALBUMIN / CREATININE URINE RATIO
Creatinine, Urine: 59.2 mg/dL
Microalb/Creat Ratio: 54 mg/g{creat} — ABNORMAL HIGH (ref 0–29)
Microalbumin, Urine: 31.7 ug/mL

## 2024-03-21 ENCOUNTER — Telehealth: Payer: Self-pay

## 2024-03-21 NOTE — Telephone Encounter (Signed)
 Please review and advise.

## 2024-03-21 NOTE — Telephone Encounter (Signed)
 Copied from CRM [id number]. Topic: Clinical - Medical Advice >> Mar 21, 2024  2:43 PM Sophia H wrote: Reason for CRM: Patient is calling in to inform provider that she took her last dose of predniSONE  (DELTASONE ) 20 MG tablet today 06/20 and states her ear is still clogged up. Wanting to know if she should ride it out over the weekend and see if it gets better or what her next steps should be. Please advise, [phone]

## 2024-03-24 NOTE — Telephone Encounter (Signed)
 Pt called back, checking status what she should do about her ear still being called after taking med

## 2024-03-24 NOTE — Telephone Encounter (Signed)
 Apt 03/27/2024

## 2024-03-27 ENCOUNTER — Encounter: Payer: Self-pay | Admitting: Nurse Practitioner

## 2024-03-27 ENCOUNTER — Ambulatory Visit (INDEPENDENT_AMBULATORY_CARE_PROVIDER_SITE_OTHER): Admitting: Nurse Practitioner

## 2024-03-27 ENCOUNTER — Other Ambulatory Visit: Payer: Self-pay | Admitting: Nurse Practitioner

## 2024-03-27 VITALS — BP 133/89 | HR 76 | Temp 97.7°F | Ht 64.0 in | Wt 157.0 lb

## 2024-03-27 DIAGNOSIS — H6501 Acute serous otitis media, right ear: Secondary | ICD-10-CM

## 2024-03-27 DIAGNOSIS — E119 Type 2 diabetes mellitus without complications: Secondary | ICD-10-CM

## 2024-03-27 MED ORDER — FLUTICASONE PROPIONATE 50 MCG/ACT NA SUSP: 2.0000 | Freq: Every day | NASAL | 6 refills | Status: AC

## 2024-03-27 MED ORDER — CEFDINIR 300 MG PO CAPS: 300.0000 mg | ORAL_CAPSULE | Freq: Two times a day (BID) | ORAL | 0 refills | Status: DC

## 2024-03-27 NOTE — Progress Notes (Signed)
   Subjective:    Patient ID: Kara Cortez, female    DOB: [birth year], 73 y.o.   MRN: [id number]   Chief Complaint: Ear stopped up (Started 2 weeks ago/)   HPI  Patient comes in c/o of ears stopped up. Started about 2 weeks ago. Right worse then left.  Patient Active Problem List   Diagnosis Date Noted   Long-term current use of injectable noninsulin antidiabetic medication 03/19/2023   Primary hypertension 09/07/2020   Hyperlipidemia associated with type 2 diabetes mellitus (HCC) 04/06/2017   Allergic rhinitis 08/03/2015   BMI 29.0-29.9,adult 06/17/2015   Diabetes mellitus treated with oral medication (HCC) 12/01/2014       Review of Systems  Constitutional:  Negative for chills, diaphoresis and fever.  HENT:  Negative for ear discharge and ear pain.   Eyes:  Negative for pain.  Respiratory:  Negative for shortness of breath.   Cardiovascular:  Negative for chest pain, palpitations and leg swelling.  Gastrointestinal:  Negative for abdominal pain.  Endocrine: Negative for polydipsia.  Skin:  Negative for rash.  Neurological:  Negative for dizziness, weakness and headaches.  Hematological:  Does not bruise/bleed easily.  All other systems reviewed and are negative.      Objective:   Physical Exam Constitutional:      Appearance: Normal appearance.  HENT:     Right Ear: Tympanic membrane is erythematous and retracted.     Left Ear: Tympanic membrane normal.   Cardiovascular:     Rate and Rhythm: Normal rate and regular rhythm.     Heart sounds: Normal heart sounds.  Pulmonary:     Effort: Pulmonary effort is normal.     Breath sounds: Normal breath sounds.   Skin:    General: Skin is warm.   Neurological:     General: No focal deficit present.     Mental Status: She is alert and oriented to person, place, and time.   Psychiatric:        Mood and Affect: Mood normal.        Behavior: Behavior normal.    BP 133/89   Pulse 76   Temp 97.7 F (36.5  C) (Temporal)   Ht 5' 4 (1.626 m)   Wt 157 lb (71.2 kg)   SpO2 96%   BMI 26.95 kg/m         Assessment & Plan:   Kara Cortez in today with chief complaint of Ear stopped up (Started 2 weeks ago/)   1. Non-recurrent acute serous otitis media of right ear (Primary) Force fluids RTO prn - cefdinir (OMNICEF) 300 MG capsule; Take 1 capsule (300 mg total) by mouth 2 (two) times daily. 1 po BID  Dispense: 20 capsule; Refill: 0    The above assessment and management plan was discussed with the patient. The patient verbalized understanding of and has agreed to the management plan. Patient is aware to call the clinic if symptoms persist or worsen. Patient is aware when to return to the clinic for a follow-up visit. Patient educated on when it is appropriate to go to the emergency department.   Mary-Margaret Gladis, FNP

## 2024-03-27 NOTE — Patient Instructions (Signed)
 Otitis Media, Adult    Otitis media is a condition in which the middle ear is red and swollen (inflamed) and full of fluid. The middle ear is the part of the ear that contains bones for hearing as well as air that helps send sounds to the brain. The condition usually goes away on its own.  What are the causes?  This condition is caused by a blockage in the eustachian tube. This tube connects the middle ear to the back of the nose. It normally allows air into the middle ear. The blockage is caused by fluid or swelling. Problems that can cause blockage include:  A cold or infection that affects the nose, mouth, or throat.  Allergies.  An irritant, such as tobacco smoke.  Adenoids that have become large. The adenoids are soft tissue located in the back of the throat, behind the nose and the roof of the mouth.  Growth or swelling in the upper part of the throat, just behind the nose (nasopharynx).  Damage to the ear caused by a change in pressure. This is called barotrauma.  What increases the risk?  You are more likely to develop this condition if you:  Smoke or are exposed to tobacco smoke.  Have an opening in the roof of your mouth (cleft palate).  Have acid reflux.  Have problems in your body's defense system (immune system).  What are the signs or symptoms?  Symptoms of this condition include:  Ear pain.  Fever.  Problems with hearing.  Being tired.  Fluid leaking from the ear.  Ringing in the ear.  How is this treated?  This condition can go away on its own within 3-5 days. But if the condition is caused by germs (bacteria) and does not go away on its own, or if it keeps coming back, your doctor may:  Give you antibiotic medicines.  Give you medicines for pain.  Follow these instructions at home:  Take over-the-counter and prescription medicines only as told by your doctor.  If you were prescribed an antibiotic medicine, take it as told by your doctor. Do not stop taking it even if you start to feel better.  Keep  all follow-up visits.  Contact a doctor if:  You have bleeding from your nose.  There is a lump on your neck.  You are not feeling better in 5 days.  You feel worse instead of better.  Get help right away if:  You have pain that is not helped with medicine.  You have swelling, redness, or pain around your ear.  You get a stiff neck.  You cannot move part of your face (paralysis).  You notice that the bone behind your ear hurts when you touch it.  You get a very bad headache.  Summary  Otitis media means that the middle ear is red, swollen, and full of fluid.  This condition usually goes away on its own.  If the problem does not go away, treatment may be needed. You may be given medicines to treat the infection or to treat your pain.  If you were prescribed an antibiotic medicine, take it as told by your doctor. Do not stop taking it even if you start to feel better.  Keep all follow-up visits.  This information is not intended to replace advice given to you by your health care provider. Make sure you discuss any questions you have with your health care provider.  Document Revised: 12/27/2020 Document Reviewed: 12/27/2020  Elsevier  Patient Education  2024 [employer].

## 2024-07-25 DIAGNOSIS — E119 Type 2 diabetes mellitus without complications: Secondary | ICD-10-CM | POA: Diagnosis not present

## 2024-07-25 DIAGNOSIS — H04123 Dry eye syndrome of bilateral lacrimal glands: Secondary | ICD-10-CM | POA: Diagnosis not present

## 2024-07-25 DIAGNOSIS — H524 Presbyopia: Secondary | ICD-10-CM | POA: Diagnosis not present

## 2024-07-25 LAB — OPHTHALMOLOGY REPORT-SCANNED

## 2024-09-17 NOTE — Progress Notes (Signed)
 Pharmacy Quality Measure Review  This patient is appearing on a report for being at risk of failing the adherence measure for hypertension (ACEi/ARB) medications this calendar year.   Medication: olmesartan  40 mg Last fill date: 08/07/24 for 90 day supply  Insurance report was not up to date. No action needed at this time.   Jenkins Graces, PharmD PGY1 Pharmacy Resident

## 2024-09-18 ENCOUNTER — Ambulatory Visit: Payer: Self-pay | Admitting: Nurse Practitioner

## 2024-09-18 ENCOUNTER — Ambulatory Visit: Admitting: Nurse Practitioner

## 2024-09-18 ENCOUNTER — Encounter: Payer: Self-pay | Admitting: Nurse Practitioner

## 2024-09-18 VITALS — BP 149/87 | HR 68 | Temp 97.5°F | Ht 64.0 in | Wt 165.0 lb

## 2024-09-18 DIAGNOSIS — Z6829 Body mass index (BMI) 29.0-29.9, adult: Secondary | ICD-10-CM

## 2024-09-18 DIAGNOSIS — I10 Essential (primary) hypertension: Secondary | ICD-10-CM | POA: Diagnosis not present

## 2024-09-18 DIAGNOSIS — E1169 Type 2 diabetes mellitus with other specified complication: Secondary | ICD-10-CM | POA: Diagnosis not present

## 2024-09-18 DIAGNOSIS — E782 Mixed hyperlipidemia: Secondary | ICD-10-CM | POA: Diagnosis not present

## 2024-09-18 DIAGNOSIS — Z7985 Long-term (current) use of injectable non-insulin antidiabetic drugs: Secondary | ICD-10-CM | POA: Diagnosis not present

## 2024-09-18 DIAGNOSIS — E119 Type 2 diabetes mellitus without complications: Secondary | ICD-10-CM | POA: Diagnosis not present

## 2024-09-18 DIAGNOSIS — E785 Hyperlipidemia, unspecified: Secondary | ICD-10-CM

## 2024-09-18 DIAGNOSIS — Z7984 Long term (current) use of oral hypoglycemic drugs: Secondary | ICD-10-CM

## 2024-09-18 LAB — CMP14+EGFR
ALT: 17 IU/L (ref 0–32)
AST: 18 IU/L (ref 0–40)
Albumin: 4.1 g/dL (ref 3.8–4.8)
Alkaline Phosphatase: 93 IU/L (ref 49–135)
BUN/Creatinine Ratio: 14 (ref 12–28)
BUN: 12 mg/dL (ref 8–27)
Bilirubin Total: 0.4 mg/dL (ref 0.0–1.2)
CO2: 24 mmol/L (ref 20–29)
Calcium: 9.4 mg/dL (ref 8.7–10.3)
Chloride: 100 mmol/L (ref 96–106)
Creatinine, Ser: 0.87 mg/dL (ref 0.57–1.00)
Globulin, Total: 2.1 g/dL (ref 1.5–4.5)
Glucose: 140 mg/dL — ABNORMAL HIGH (ref 70–99)
Potassium: 4.1 mmol/L (ref 3.5–5.2)
Sodium: 141 mmol/L (ref 134–144)
Total Protein: 6.2 g/dL (ref 6.0–8.5)
eGFR: 71 mL/min/1.73 (ref >59)

## 2024-09-18 LAB — CBC WITH DIFFERENTIAL/PLATELET
Basophils Absolute: 0.1 x10E3/uL (ref 0.0–0.2)
Basos: 1 %
EOS (ABSOLUTE): 0.3 x10E3/uL (ref 0.0–0.4)
Eos: 4 %
Hematocrit: 43.3 % (ref 34.0–46.6)
Hemoglobin: 14.2 g/dL (ref 11.1–15.9)
Immature Grans (Abs): 0 x10E3/uL (ref 0.0–0.1)
Immature Granulocytes: 0 %
Lymphocytes Absolute: 2.3 x10E3/uL (ref 0.7–3.1)
Lymphs: 32 %
MCH: 27.7 pg (ref 26.6–33.0)
MCHC: 32.8 g/dL (ref 31.5–35.7)
MCV: 84 fL (ref 79–97)
Monocytes Absolute: 0.5 x10E3/uL (ref 0.1–0.9)
Monocytes: 7 %
Neutrophils Absolute: 4.1 x10E3/uL (ref 1.4–7.0)
Neutrophils: 56 %
Platelets: 216 x10E3/uL (ref 150–450)
RBC: 5.13 x10E6/uL (ref 3.77–5.28)
RDW: 13.7 % (ref 11.7–15.4)
WBC: 7.2 x10E3/uL (ref 3.4–10.8)

## 2024-09-18 LAB — LIPID PANEL
Chol/HDL Ratio: 2.3 ratio (ref 0.0–4.4)
Cholesterol, Total: 158 mg/dL (ref 100–199)
HDL: 70 mg/dL (ref >39)
LDL Chol Calc (NIH): 62 mg/dL (ref 0–99)
Triglycerides: 156 mg/dL — ABNORMAL HIGH (ref 0–149)
VLDL Cholesterol Cal: 26 mg/dL (ref 5–40)

## 2024-09-18 LAB — BAYER DCA HB A1C WAIVED: HB A1C (BAYER DCA - WAIVED): 7.6 % — ABNORMAL HIGH (ref 4.8–5.6)

## 2024-09-18 MED ORDER — OLMESARTAN MEDOXOMIL 40 MG PO TABS: 40.0000 mg | ORAL_TABLET | Freq: Every day | ORAL | 1 refills | Status: AC

## 2024-09-18 MED ORDER — ATORVASTATIN CALCIUM 40 MG PO TABS: 40.0000 mg | ORAL_TABLET | Freq: Every day | ORAL | 1 refills | Status: AC

## 2024-09-18 MED ORDER — OZEMPIC (2 MG/DOSE) 8 MG/3ML ~~LOC~~ SOPN: 2.0000 mg | PEN_INJECTOR | SUBCUTANEOUS | 1 refills | Status: AC

## 2024-09-18 MED ORDER — GLIPIZIDE 10 MG PO TABS: ORAL_TABLET | ORAL | 1 refills | Status: AC

## 2024-09-18 NOTE — Progress Notes (Signed)
 Subjective:    Patient ID: Kara Cortez, female    DOB: 08/27/1951, 73 y.o.   MRN: [id number]   Chief Complaint: medical management of chronic issues    HPI:  Kara Cortez is a 73 y.o. who identifies as a female who was assigned female at birth.   Social history: Lives with: husband Work history: retired   Water Engineer in today for follow up of the following chronic medical issues:  1. Primary hypertension No c/o chest pain, sob or headache. Does not check blood pressure at home. BP Readings from Last 3 Encounters:  03/27/24 133/89  03/17/24 136/82  12/20/23 (!) 141/86     2. Diabetes mellitus treated with oral medication (HCC) She does not check blood sugars at home. Lab Results  Component Value Date   HGBA1C 7.2 (H) 03/17/2024     3. Long-term current use of injectable noninsulin antidiabetic medication Is on ozempic  weekly  4. Hyperlipidemia associated with type 2 diabetes mellitus (HCC) Is on lipitor and is doing well. She does try to wtahc diet but no dedictaed exercise. Lab Results  Component Value Date   CHOL 176 03/17/2024   HDL 63 03/17/2024   LDLCALC 84 03/17/2024   TRIG 169 (H) 03/17/2024   CHOLHDL 2.8 03/17/2024   The 10-year ASCVD risk score (Arnett DK, et al., 2019) is: 28.6%   5. BMI 29.0-29.9,adult No recent weight changes  Wt Readings from Last 3 Encounters:  09/18/24 165 lb (74.8 kg)  03/27/24 157 lb (71.2 kg)  03/17/24 160 lb (72.6 kg)   BMI Readings from Last 3 Encounters:  09/18/24 28.32 kg/m  03/27/24 26.95 kg/m  03/17/24 27.46 kg/m       New complaints: Left hip pain. Intermittent. Certain positions increase pain. Rates 4/4 currently. Pain does not radiate down leg.   Allergies  Allergen Reactions   Amoxicillin  Rash   Outpatient Encounter Medications as of 09/18/2024  Medication Sig   atorvastatin  (LIPITOR) 40 MG tablet Take 1 tablet (40 mg total) by mouth daily.   cefdinir  (OMNICEF ) 300 MG capsule Take  1 capsule (300 mg total) by mouth 2 (two) times daily. 1 po BID   Continuous Blood Gluc Receiver (FREESTYLE LIBRE 2 READER) DEVI 1 each by Does not apply route daily.   Continuous Glucose Sensor (FREESTYLE LIBRE 2 PLUS SENSOR) MISC Apply 1 each topically every 14 (fourteen) days. Change device every 14 days   fluticasone  (FLONASE ) 50 MCG/ACT nasal spray Place 2 sprays into both nostrils daily.   glipiZIDE  (GLUCOTROL ) 10 MG tablet Take 2 po q am and 1 po qhs   HYDROcodone  bit-homatropine (HYCODAN) 5-1.5 MG/5ML syrup Take 5 mLs by mouth every 6 (six) hours as needed for cough.   ipratropium (ATROVENT ) 0.03 % nasal spray Place 2 sprays into both nostrils every 12 (twelve) hours.   olmesartan  (BENICAR ) 40 MG tablet Take 1 tablet (40 mg total) by mouth daily.   ONETOUCH DELICA LANCETS FINE MISC Test 1 x a day and prn  Dx E11.9   Semaglutide , 2 MG/DOSE, (OZEMPIC , 2 MG/DOSE,) 8 MG/3ML SOPN Inject 2 mg into the skin once a week.   No facility-administered encounter medications on file as of 09/18/2024.    Past Surgical History:  Procedure Laterality Date   BUNIONECTOMY     TONSILLECTOMY      Family History  Problem Relation Age of Onset   Diabetes Mother    Hypertension Mother    Heart disease Mother    Hyperlipidemia  Father    Cancer Father        bladder   Emphysema Father       Controlled substance contract: n/a     Review of Systems  Constitutional:  Negative for diaphoresis.  Eyes:  Negative for pain.  Respiratory:  Negative for shortness of breath.   Cardiovascular:  Negative for chest pain, palpitations and leg swelling.  Gastrointestinal:  Negative for abdominal pain.  Endocrine: Negative for polydipsia.  Skin:  Negative for rash.  Neurological:  Negative for dizziness, weakness and headaches.  Hematological:  Does not bruise/bleed easily.  All other systems reviewed and are negative.      Objective:   Physical Exam Vitals and nursing note reviewed.   Constitutional:      General: She is not in acute distress.    Appearance: Normal appearance. She is well-developed.  HENT:     Head: Normocephalic.     Right Ear: Tympanic membrane normal.     Left Ear: Tympanic membrane normal.     Nose: Nose normal.     Mouth/Throat:     Mouth: Mucous membranes are moist.  Eyes:     Pupils: Pupils are equal, round, and reactive to light.  Neck:     Vascular: No carotid bruit or JVD.  Cardiovascular:     Rate and Rhythm: Normal rate and regular rhythm.     Heart sounds: Normal heart sounds.  Pulmonary:     Effort: Pulmonary effort is normal. No respiratory distress.     Breath sounds: Normal breath sounds. No wheezing or rales.  Chest:     Chest wall: No tenderness.  Abdominal:     General: Bowel sounds are normal. There is no distension or abdominal bruit.     Palpations: Abdomen is soft. There is no hepatomegaly, splenomegaly, mass or pulsatile mass.     Tenderness: There is no abdominal tenderness.  Musculoskeletal:        General: Normal range of motion.     Cervical back: Normal range of motion and neck supple.  Lymphadenopathy:     Cervical: No cervical adenopathy.  Skin:    General: Skin is warm and dry.  Neurological:     Mental Status: She is alert and oriented to person, place, and time.     Deep Tendon Reflexes: Reflexes are normal and symmetric.  Psychiatric:        Behavior: Behavior normal.        Thought Content: Thought content normal.        Judgment: Judgment normal.     Hgba1c 7.6%  BP (!) 149/87   Pulse 68   Temp (!) 97.5 F (36.4 C) (Temporal)   Ht 5' 4 (1.626 m)   Wt 165 lb (74.8 kg)   SpO2 95%   BMI 28.32 kg/m          Assessment & Plan:   [name] comes in today with chief complaint of medical management of chronic issues    Diagnosis and orders addressed:  1. Primary hypertension Low sodium diet - CBC with Differential/Platelet - CMP14+EGFR  2. Diabetes mellitus treated  with oral medication (HCC) Continue to watch carbs in diet - Bayer DCA Hb A1c Waived  3. Long-term current use of injectable noninsulin antidiabetic medication Continue current dose  4. Hyperlipidemia associated with type 2 diabetes mellitus (HCC) Low fat diet - Lipid panel  5. BMI 29.0-29.9,adult Discussed diet and exercise for person with BMI >25 Will recheck  weight in 3-6 months  Labs pending Health Maintenance reviewed Diet and exercise encouraged  Follow up plan: 6 months   Mary-Margaret Gladis, FNP

## 2024-09-19 ENCOUNTER — Ambulatory Visit: Payer: Self-pay | Admitting: Nurse Practitioner

## 2024-11-04 ENCOUNTER — Ambulatory Visit: Payer: PPO

## 2025-03-19 ENCOUNTER — Ambulatory Visit: Admitting: Nurse Practitioner
